# Patient Record
Sex: Female | Born: 1950 | Race: Black or African American | Hispanic: No | Smoking: Never smoker
Health system: Southern US, Community
[De-identification: ages and names within clinical notes are randomized; demographics above are authoritative.]

## PROBLEM LIST (undated history)

## (undated) DIAGNOSIS — Z87442 Personal history of urinary calculi: Secondary | ICD-10-CM

## (undated) DIAGNOSIS — D649 Anemia, unspecified: Secondary | ICD-10-CM

## (undated) DIAGNOSIS — I10 Essential (primary) hypertension: Secondary | ICD-10-CM

## (undated) DIAGNOSIS — R0600 Dyspnea, unspecified: Secondary | ICD-10-CM

## (undated) DIAGNOSIS — J45909 Unspecified asthma, uncomplicated: Secondary | ICD-10-CM

## (undated) DIAGNOSIS — E119 Type 2 diabetes mellitus without complications: Secondary | ICD-10-CM

## (undated) DIAGNOSIS — M1990 Unspecified osteoarthritis, unspecified site: Secondary | ICD-10-CM

## (undated) HISTORY — PX: MULTIPLE TOOTH EXTRACTIONS: SHX2053

---

## 1978-09-07 HISTORY — PX: GASTRIC BYPASS: SHX52

## 2004-01-05 ENCOUNTER — Ambulatory Visit: Payer: Self-pay | Admitting: Family Medicine

## 2005-01-16 ENCOUNTER — Ambulatory Visit: Payer: Self-pay | Admitting: Family Medicine

## 2005-01-20 ENCOUNTER — Ambulatory Visit: Payer: Self-pay | Admitting: Family Medicine

## 2006-09-14 ENCOUNTER — Ambulatory Visit (HOSPITAL_COMMUNITY): Admission: RE | Admit: 2006-09-14 | Discharge: 2006-09-14 | Payer: Self-pay | Admitting: Orthopedic Surgery

## 2006-09-16 ENCOUNTER — Ambulatory Visit: Payer: Self-pay | Admitting: Cardiology

## 2006-09-22 ENCOUNTER — Ambulatory Visit: Payer: Self-pay

## 2006-09-24 ENCOUNTER — Ambulatory Visit: Payer: Self-pay

## 2006-10-21 ENCOUNTER — Inpatient Hospital Stay (HOSPITAL_COMMUNITY): Admission: AD | Admit: 2006-10-21 | Discharge: 2006-10-23 | Payer: Self-pay | Admitting: Orthopedic Surgery

## 2008-09-27 ENCOUNTER — Ambulatory Visit (HOSPITAL_COMMUNITY): Admission: RE | Admit: 2008-09-27 | Discharge: 2008-09-27 | Payer: Self-pay | Admitting: Family Medicine

## 2008-11-21 ENCOUNTER — Other Ambulatory Visit: Admission: RE | Admit: 2008-11-21 | Discharge: 2008-11-21 | Payer: Self-pay | Admitting: Unknown Physician Specialty

## 2010-01-06 HISTORY — PX: ARTHOSCOPIC ROTAOR CUFF REPAIR: SHX5002

## 2010-05-21 NOTE — Op Note (Signed)
[name], Calderon            ACCOUNT NO.:  [id number]   MEDICAL RECORD NO.:  [id number]          PATIENT TYPE:  OIB   LOCATION:  5037                         FACILITY:  [facility]   PHYSICIAN:  Burnard Bunting, M.D.    DATE OF BIRTH:  Apr 27, 1950   DATE OF PROCEDURE:  10/20/2006  DATE OF DISCHARGE:                               OPERATIVE REPORT   PREOPERATIVE DIAGNOSIS:  Right shoulder rotator cuff tear and bursitis.   POSTOPERATIVE DIAGNOSIS:  Right shoulder rotator cuff tear and bursitis.   PROCEDURE:  Right shoulder diagnostic arthroscopy with subacromial  decompression and mini open rotator cuff repair.   SURGEON:  Burnard Bunting, M.D.   ASSISTANT:  Jerolyn Shin. Tresa Res, M.D.   ANESTHESIA:  General endotracheal.   ESTIMATED BLOOD LOSS:  Minimal.   INDICATION:  Cynthia Calderon is a 60 year old patient who injured her  right shoulder at work.  MRI scan consistent with rotator cuff tear.  She presents now for operative management.   OPERATIVE FINDINGS:  1. Examination under anesthesia range of motion external rotation 50      degrees, abduction 80, forward flexion 180, external rotation 90      degrees, abduction is about 95.  2. Diagnostic arthroscopy.  3. Intact glenohumeral articular surface.  4. Stable biceps anchor.  5. Rotator cuff tear of the supraspinatus measuring about 2 x 2 cm.  6. Significant bursitis.   PROCEDURE IN DETAIL:  The patient brought to operating room where  general endotracheal anesthesia was induced.  Preoperative antibiotics  were administered.  The patient was morbidly obese which made the case  exceedingly difficult in terms of retraction and visualization.  The  patient was placed with the head in neutral position in the Schlein type  positioner and then sat upright.  The right arm, hand, elbow and  shoulder was prepped with DuraPrep solution and draped in sterile  manner.  Topographic anatomy of the shoulder was identified between the  posterior lateral and anterior margin of acromion as well as the  coracoid process.  Solution of saline with epinephrine injected  subacromial space. Saline was injected into the joint, posterior portal  was created 2 cm medial inferior to posterior lateral margin of  acromion.  Diagnostic arthroscopy performed.  A spinal needle was placed  through the anticipated location of the anterior portal.  The biceps  tendon was stable and intact.  Rotator cuff tear of supraspinatus was  visualized measuring 2 x 2 cm.  Subscap and infraspinatus were intact.  Glenohumeral articular surfaces were intact.  There was no SLAP lesion  present.  At this time the scope was placed in the subacromial space.  Lateral portals created in anticipation of the site of the mini open  repair.  Subacromial decompression was performed along with bursectomy.  Instruments were removed.  Posterior portals closed using 3-0 nylon.  Reprepping with a DuraPrep was then performed on the shoulder region  which was then covered with Ioban.  The lateral portal incision off the  anterolateral margin of the acromion was extended.  Skin subcutaneous  tissue were sharply divided, bleeding  points encountered controlled  using electrocautery.  Deltoid split a measured distance of 4 cm from  the anterior lateral margin of the acromion.  Stay suture #1 Vicryl was  placed.  The rotator cuff tear was visualized and tear was a U-shaped  tear.  It was brought together using two interrupted inverted 2-0  FiberWire sutures.  Then tear was brought down to a bleeding footprint,  bleeding bony footprint using two 5.5 corkscrew suture anchors and two  4.5 push locks.  Watertight repair was achieved.  Arm was taken through  range of motion and found to have no grinding or crepitus.  Adequate  decompression was visualized. CA ligament was released but not resected.  At this time the shoulder was thoroughly irrigated.  Deltoid was closed  using a #1  Vicryl suture.  Skin was closed using interrupted inverted 2-  0 Vicryl suture followed by a running 3-0 pullout Prolene.  Bulky  dressing was applied along with a sling.  The patient tolerated  procedure well without immediate complications.  Should be noted that  Dr. Lenny Pastel assistance was required at all times during the case for  retraction of important neurovascular structures and positioning of the  arm, especially in this morbidly obese patient.  With him positioning of  the arm was difficult due to the sheer size.  Her body mass index  exceeds 35.      Burnard Bunting, M.D.  Electronically Signed     GSD/MEDQ  D:  10/20/2006  T:  10/21/2006  Job:  [id number]

## 2010-05-21 NOTE — Assessment & Plan Note (Signed)
[facility]                            CARDIOLOGY OFFICE NOTE   Cynthia, ARMENDAREZ                     MRN:          [id number]  DATE:09/16/2006                            DOB:          [birth year]    Cynthia Calderon is a very pleasant 60 year old female who I am asked to  evaluate for chest pain and preoperatively prior to repair of rotator  cuff.  Note, she has no prior cardiac history.  She typically does not  have dyspnea on exertion, orthopnea, PND, pedal edema, palpitations,  presyncope, syncope, or exertional chest pain.  She recently hurt her  right shoulder while working.  She was scheduled for rotator cuff  surgery.  However, this past Saturday she had chest pain.  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  It was in the substernal area and was  described as a sharp sensation radiating to the back.  It lasted for  approximately 10-15 minutes and resolved spontaneously. The pain was not  pleuritic or positional nor was it related to food.  There was no  associated nausea and vomiting, shortness of breath, or diaphoresis.  She drank a Coke and belched and eventually the pain resolved.  She has  had no symptoms since then.  Because of the above, we were asked to  further evaluated.   MEDICATIONS:  Relafen 750 mg p.o. b.i.d.   ALLERGIES:  No known drug allergies.   SOCIAL HISTORY:  She does not smoke nor does she consume alcohol.   FAMILY HISTORY:  Positive for congestive heart failure.  She also states  her father had coronary artery disease.   PAST MEDICAL HISTORY:  There is no diabetes mellitus or hyperlipidemia.  She does have a history of hypertension when she was on birth control  pills.  She has had previous gastric bypass surgery and lost  approximately 300 pounds at that time in the 1980's.  She otherwise has  no significant past medical history.  She has had two children.   REVIEW OF SYSTEMS:  She denies any headaches,  fevers, or chills.  There  is no productive cough or hemoptysis.  There is no dysphagia,  odynophagia, melena, or hematochezia.  There is no dysuria or hematuria.  There is no rash or seizure activity.  There is no orthopnea or PND, but  she can occasionally have pedal edema in the left lower extremity which  has been a longterm issue.  She does occasionally have pain in the right  shoulder where she had her injury.  The remaining systems are negative.   PHYSICAL EXAMINATION:  VITAL SIGNS:  Blood pressure 156/82, pulse 67.  She weighs 311 pounds.  GENERAL:  She is well-developed and morbidly obese.  She is in no acute  distress.  SKIN:  Warm and dry.  She does not appear depressed.  EXTREMITIES:  No peripheral clubbing.  BACK:  Normal.  HEENT:  Normal with normal eye lids.  NECK:  Supple with a normal upstroke bilaterally and I cannot appreciate  bruits.  No jugular venous distention and no thyromegaly is noted.  CHEST:  Clear to auscultation with normal expansion.  HEART:  Regular rate and rhythm with normal S1 and S2.  There is a soft  1/6 systolic ejection murmur at the left sternal border.  There is no S3  or S4.  ABDOMEN:  Nontender and nondistended.  Positive bowel sounds.  No  hepatosplenomegaly.  No masses appreciated.  No abdominal bruit.  She is  status post gastric bypass surgery and the previous incision site is  evident.  Her femoral pulses are difficult to palpate due to her  obesity.  I cannot appreciate bruits.  EXTREMITIES:  Trace edema bilaterally.  There are no cords palpated.  She has 2+ dorsalis pedis pulses bilaterally.  NEUROLOGY:  Grossly intact.   EKG shows a sinus rhythm at a rate of 57.  The axis is normal.  No ST  changes noted.   DIAGNOSIS:  1. Atypical chest pain - the patient's symptoms are atypical and her      electrocardiogram is normal.  However, given her upcoming surgery      we will plan to risk stratify with an Adenosine Myoview.  If it       shows no ischemia then I do not think we need to pursue further      cardiac workup.  2. Preoperative evaluation - aspirin.  3. Elevated blood pressure - her blood pressure is mildly elevated      today and this will need to be tracked and an antihypertensive      initiated as indicated.  I will leave this to her primary care      physician.  4. History of osteoarthritis.  5. Obesity.   We will see her back on a p.r.n. basis.     Madolyn Frieze Jens Som, MD, [facility]  Electronically Signed    BSC/MedQ  DD: 09/16/2006  DT: 09/17/2006  Job #: [id number]   cc:   G. Dorene Grebe, M.D.

## 2010-05-24 NOTE — Discharge Summary (Signed)
[name], [name]            ACCOUNT NO.:  [id number]   MEDICAL RECORD NO.:  [id number]          PATIENT TYPE:  INP   LOCATION:  5037                         FACILITY:  [facility]   PHYSICIAN:  Burnard Bunting, M.D.    DATE OF BIRTH:  [birth year]   DATE OF ADMISSION:  10/20/2006  DATE OF DISCHARGE:  10/23/2006                               DISCHARGE SUMMARY   DISCHARGE DIAGNOSIS:  Right shoulder rotator cuff tear.   SECONDARY DIAGNOSES:  1. Hypertension.  2. Obesity.   OPERATIONS AND PROCEDURES:  Right shoulder rotator cuff repair performed  October 20, 2006.   HOSPITAL COURSE:  Cynthia Calderon is a 60 year old patient with  shoulder rotator cuff tear.  She presents for operative management.  She  underwent rotator cuff tear repair and arthroscopy October 20, 2006.  She tolerated the procedure well without immediate complications.  She  required IV pain medicines for the following 2 days.  Her fingers were  mobile.  Therapy was started for mobilization of her shoulder in a  passive manner with pendulum exercises.  She had an otherwise  unremarkable recovery.  Finally was to be seen on postop day #2 because  of inability to void.  Pain was controlled on oral pain medications.  She will continue her sling and will follow up with me in 7 days for  suture removal.   DISCHARGE MEDICATIONS:  1. Percocet 1-2 p.o. q.3-4 hours p.r.n. pain.  2. Robaxin 500 mg IV q.6 hours p.r.n. spasm.   DISPOSITION:  She was discharged in good condition.  She will also have  a TPN machine at home.      Burnard Bunting, M.D.  Electronically Signed     GSD/MEDQ  D:  12/06/2006  T:  12/07/2006  Job:  [id number]

## 2010-10-17 LAB — BASIC METABOLIC PANEL
BUN: 16
BUN: 17
CO2: 22
Calcium: 9.2
Calcium: 9.6
Creatinine, Ser: 1.33 — ABNORMAL HIGH
GFR calc Af Amer: 50 — ABNORMAL LOW
GFR calc non Af Amer: 41 — ABNORMAL LOW
GFR calc non Af Amer: >60
Glucose, Bld: 80
Glucose, Bld: 97
Potassium: 4.6
Sodium: 137

## 2010-10-17 LAB — CBC
MCHC: 31.9
Platelets: 300

## 2010-10-18 LAB — BASIC METABOLIC PANEL
BUN: 14
Calcium: 8.8
Chloride: 110
Creatinine, Ser: 0.76
GFR calc Af Amer: >60
Glucose, Bld: 84
Potassium: 5.5 — ABNORMAL HIGH

## 2010-10-18 LAB — CBC
HCT: 39.6
MCHC: 32.1
MCV: 83.5
Platelets: 178
WBC: 7.8

## 2011-05-29 ENCOUNTER — Encounter (INDEPENDENT_AMBULATORY_CARE_PROVIDER_SITE_OTHER): Payer: Self-pay | Admitting: *Deleted

## 2011-06-03 ENCOUNTER — Other Ambulatory Visit (HOSPITAL_COMMUNITY): Payer: Self-pay | Admitting: Family Medicine

## 2011-06-03 DIAGNOSIS — Z139 Encounter for screening, unspecified: Secondary | ICD-10-CM

## 2011-06-03 DIAGNOSIS — M8580 Other specified disorders of bone density and structure, unspecified site: Secondary | ICD-10-CM

## 2011-06-05 ENCOUNTER — Encounter (INDEPENDENT_AMBULATORY_CARE_PROVIDER_SITE_OTHER): Payer: Self-pay | Admitting: *Deleted

## 2011-06-05 ENCOUNTER — Other Ambulatory Visit (INDEPENDENT_AMBULATORY_CARE_PROVIDER_SITE_OTHER): Payer: Self-pay | Admitting: *Deleted

## 2011-06-05 ENCOUNTER — Telehealth (INDEPENDENT_AMBULATORY_CARE_PROVIDER_SITE_OTHER): Payer: Self-pay | Admitting: *Deleted

## 2011-06-05 DIAGNOSIS — Z1211 Encounter for screening for malignant neoplasm of colon: Secondary | ICD-10-CM

## 2011-06-05 MED ORDER — PEG-KCL-NACL-NASULF-NA ASC-C 100 G PO SOLR: 1.0000 | Freq: Once | ORAL | Status: DC

## 2011-06-05 NOTE — Telephone Encounter (Signed)
Patient needs movi prep 

## 2011-06-06 ENCOUNTER — Ambulatory Visit (HOSPITAL_COMMUNITY)
Admission: RE | Admit: 2011-06-06 | Discharge: 2011-06-06 | Disposition: A | Discharge status: Home / Self Care | Payer: Medicare Other | Source: Ambulatory Visit | Attending: Family Medicine | Admitting: Family Medicine

## 2011-06-06 DIAGNOSIS — Z139 Encounter for screening, unspecified: Secondary | ICD-10-CM

## 2011-06-06 DIAGNOSIS — M899 Disorder of bone, unspecified: Secondary | ICD-10-CM | POA: Insufficient documentation

## 2011-06-06 DIAGNOSIS — M8580 Other specified disorders of bone density and structure, unspecified site: Secondary | ICD-10-CM

## 2011-06-06 DIAGNOSIS — Z1231 Encounter for screening mammogram for malignant neoplasm of breast: Secondary | ICD-10-CM | POA: Insufficient documentation

## 2011-07-24 ENCOUNTER — Encounter (HOSPITAL_COMMUNITY): Payer: Self-pay | Admitting: Pharmacy Technician

## 2011-07-28 ENCOUNTER — Telehealth (INDEPENDENT_AMBULATORY_CARE_PROVIDER_SITE_OTHER): Payer: Self-pay | Admitting: *Deleted

## 2011-07-28 NOTE — Telephone Encounter (Signed)
PCP/Requesting MD: nyland  Name & DOB: Cynthia Calderon 01/28/1950     Procedure: tcs  Reason/Indication:  screening  Has patient had this procedure before?  no  If so, when, by whom and where?    Is there a family history of colon cancer?  no  Who?  What age when diagnosed?    Is patient diabetic?   no      Does patient have prosthetic heart valve?  no  Do you have a pacemaker?  no  Has patient had joint replacement within last 12 months?  no  Is patient on Coumadin, Plavix and/or Aspirin? no  Medications: tramadol 50 mg 1 tab bid, celebrex 200 mg bid, vitamins, tylenol prn  Allergies: nkda  Medication Adjustment:   Procedure date & time: 08/07/11 at 930

## 2011-07-28 NOTE — Telephone Encounter (Signed)
agree

## 2011-08-07 ENCOUNTER — Ambulatory Visit (HOSPITAL_COMMUNITY)
Admission: RE | Admit: 2011-08-07 | Discharge: 2011-08-07 | Disposition: A | Discharge status: Home / Self Care | Payer: Medicare Other | Source: Ambulatory Visit | Attending: Internal Medicine | Admitting: Internal Medicine

## 2011-08-07 ENCOUNTER — Encounter (HOSPITAL_COMMUNITY): Payer: Self-pay | Admitting: *Deleted

## 2011-08-07 ENCOUNTER — Encounter (HOSPITAL_COMMUNITY)
Admission: RE | Disposition: A | Discharge status: Home / Self Care | Payer: Self-pay | Source: Ambulatory Visit | Attending: Internal Medicine

## 2011-08-07 DIAGNOSIS — K5730 Diverticulosis of large intestine without perforation or abscess without bleeding: Secondary | ICD-10-CM | POA: Insufficient documentation

## 2011-08-07 DIAGNOSIS — Z1211 Encounter for screening for malignant neoplasm of colon: Secondary | ICD-10-CM

## 2011-08-07 HISTORY — PX: COLONOSCOPY: SHX5424

## 2011-08-07 HISTORY — DX: Unspecified asthma, uncomplicated: J45.909

## 2011-08-07 SURGERY — COLONOSCOPY
Anesthesia: Moderate Sedation

## 2011-08-07 MED ORDER — MEPERIDINE HCL 50 MG/ML IJ SOLN
INTRAMUSCULAR | Status: DC | PRN
  Administered 2011-08-07 (×2): 25 mg via INTRAVENOUS

## 2011-08-07 MED ORDER — MIDAZOLAM HCL 5 MG/5ML IJ SOLN
INTRAMUSCULAR | Status: AC
  Filled 2011-08-07: qty 10

## 2011-08-07 MED ORDER — STERILE WATER FOR IRRIGATION IR SOLN
Status: DC | PRN
  Administered 2011-08-07: 09:00:00

## 2011-08-07 MED ORDER — MEPERIDINE HCL 50 MG/ML IJ SOLN
INTRAMUSCULAR | Status: AC
  Filled 2011-08-07: qty 1

## 2011-08-07 MED ORDER — PROMETHAZINE HCL 25 MG/ML IJ SOLN
INTRAMUSCULAR | Status: AC
  Filled 2011-08-07: qty 1

## 2011-08-07 MED ORDER — MEPERIDINE HCL 100 MG/ML IJ SOLN
INTRAMUSCULAR | Status: AC
  Filled 2011-08-07: qty 2

## 2011-08-07 MED ORDER — MIDAZOLAM HCL 5 MG/5ML IJ SOLN
INTRAMUSCULAR | Status: DC | PRN
  Administered 2011-08-07 (×3): 2 mg via INTRAVENOUS

## 2011-08-07 MED ORDER — SODIUM CHLORIDE 0.9 % IJ SOLN
INTRAMUSCULAR | Status: AC
  Filled 2011-08-07: qty 10

## 2011-08-07 MED ORDER — SODIUM CHLORIDE 0.45 % IV SOLN
Freq: Once | INTRAVENOUS | Status: AC
  Administered 2011-08-07: 09:00:00 via INTRAVENOUS

## 2011-08-07 NOTE — H&P (Signed)
Cynthia Calderon is an 61 y.o. female.   Chief Complaint: Patient is here for colonoscopy. HPI: Patient is 61 year old African female who is in for screening colonoscopy. This is patient's first exam. She denies abdominal pain melena or rectal bleeding. She takes Celebrex for chronic back pain and having no side effects. Family history is negative for colorectal carcinoma.  Past Medical History  Diagnosis Date  . Bronchial asthma     Last Spring    Past Surgical History  Procedure Date  . Gastric bypass 1980's  . Arthoscopic rotaor cuff repair 2012    History reviewed. No pertinent family history. Social History:  reports that she has never smoked. She does not have any smokeless tobacco history on file. She reports that she does not drink alcohol or use illicit drugs.  Allergies: No Known Allergies  Medications Prior to Admission  Medication Sig Dispense Refill  . acetaminophen (TYLENOL) 500 MG tablet Take 500-1,000 mg by mouth every 6 (six) hours as needed. For pain      . Calcium Carbonate-Vitamin D (CALCIUM + D PO) Take 1 tablet by mouth daily.      . celecoxib (CELEBREX) 200 MG capsule Take 200 mg by mouth 2 (two) times daily.      . cyanocobalamin (,VITAMIN B-12,) 1000 MCG/ML injection Inject 1,000 mcg into the muscle once a week.      . Multiple Vitamin (MULTIVITAMIN WITH MINERALS) TABS Take 1 tablet by mouth daily.      Marland Kitchen omeprazole (PRILOSEC) 20 MG capsule Take 20 mg by mouth daily.      . peg 3350 powder (MOVIPREP) 100 G SOLR Take 1 kit (100 g total) by mouth once.  1 kit  0    No results found for this or any previous visit (from the past 48 hour(s)). No results found.  ROS  Blood pressure 154/78, pulse 56, temperature 97.9 F (36.6 C), temperature source Oral, resp. rate 18, SpO2 96.00%. Physical Exam  Constitutional: She appears well-developed and well-nourished.  HENT:  Mouth/Throat: Oropharynx is clear and moist.  Eyes: Conjunctivae are normal. No scleral  icterus.  Neck: No thyromegaly present.  Cardiovascular: Normal rate, regular rhythm and normal heart sounds.   No murmur heard. Respiratory: Effort normal and breath sounds normal.  GI: Soft. She exhibits no distension and no mass. There is no tenderness.  Musculoskeletal: She exhibits no edema.  Lymphadenopathy:    She has no cervical adenopathy.  Neurological: She is alert.  Skin: Skin is warm and dry.     Assessment/Plan Average risk screening colonoscopy.  REHMAN,NAJEEB U 08/07/2011, 9:18 AM

## 2011-08-07 NOTE — Op Note (Signed)
COLONOSCOPY PROCEDURE REPORT  PATIENT:  Cynthia Calderon  MR#:  [id number] Birthdate:  1951-01-06, 61 y.o., female Endoscopist:  Dr. Malissa Hippo, MD Referred By:  Dr. Josue Hector, MD Procedure Date: 08/07/2011  Procedure:   Colonoscopy  Indications:  Patient is 61 year old African female was undergoing average risk screening colonoscopy.  Informed Consent:  The procedure and risks were reviewed with the patient and informed consent was obtained.  Medications:  Demerol 50 mg IV Versed 6 mg IV  Description of procedure:  After a digital rectal exam was performed, that colonoscope was advanced from the anus through the rectum and colon to the area of the cecum, ileocecal valve and appendiceal orifice. The cecum was deeply intubated. These structures were well-seen and photographed for the record. From the level of the cecum and ileocecal valve, the scope was slowly and cautiously withdrawn. The mucosal surfaces were carefully surveyed utilizing scope tip to flexion to facilitate fold flattening as needed. The scope was pulled down into the rectum where a thorough exam including retroflexion was performed.  Findings:   Prep satisfactory. 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 No evidence of colonic polyps or other mucosal abnormalities. Normal rectal mucosa and anorectal junction.  Therapeutic/Diagnostic Maneuvers Performed:  None  Complications:  None  Cecal Withdrawal Time:  10 minutes  Impression:  Examination performed to cecum. Normal colonoscopy except single small diverticulum at hepatic flexure.  Recommendations:  Standard instructions given. Next screening exam in 10 years.  [name]  08/07/2011 9:44 AM  CC: Dr. Josue Hector, MD & Dr. Bonnetta Barry ref. provider found

## 2011-08-12 ENCOUNTER — Encounter (HOSPITAL_COMMUNITY): Payer: Self-pay | Admitting: Internal Medicine

## 2012-06-01 ENCOUNTER — Other Ambulatory Visit (HOSPITAL_COMMUNITY): Payer: Self-pay | Admitting: Family Medicine

## 2012-06-01 DIAGNOSIS — Z139 Encounter for screening, unspecified: Secondary | ICD-10-CM

## 2012-06-07 ENCOUNTER — Ambulatory Visit (HOSPITAL_COMMUNITY): Payer: Medicare Other

## 2012-06-08 ENCOUNTER — Ambulatory Visit (HOSPITAL_COMMUNITY)
Admission: RE | Admit: 2012-06-08 | Discharge: 2012-06-08 | Disposition: A | Discharge status: Home / Self Care | Payer: Medicare Other | Source: Ambulatory Visit | Attending: Family Medicine | Admitting: Family Medicine

## 2012-06-08 DIAGNOSIS — Z1231 Encounter for screening mammogram for malignant neoplasm of breast: Secondary | ICD-10-CM | POA: Insufficient documentation

## 2012-06-08 DIAGNOSIS — Z139 Encounter for screening, unspecified: Secondary | ICD-10-CM

## 2013-05-10 ENCOUNTER — Other Ambulatory Visit (HOSPITAL_COMMUNITY): Payer: Self-pay | Admitting: Adult Health Nurse Practitioner

## 2013-05-10 DIAGNOSIS — Z1231 Encounter for screening mammogram for malignant neoplasm of breast: Secondary | ICD-10-CM

## 2013-06-09 ENCOUNTER — Ambulatory Visit (HOSPITAL_COMMUNITY)
Admission: RE | Admit: 2013-06-09 | Discharge: 2013-06-09 | Disposition: A | Discharge status: Home / Self Care | Payer: Medicare Other | Source: Ambulatory Visit | Attending: Adult Health Nurse Practitioner | Admitting: Adult Health Nurse Practitioner

## 2013-06-09 DIAGNOSIS — Z1231 Encounter for screening mammogram for malignant neoplasm of breast: Secondary | ICD-10-CM

## 2014-05-05 ENCOUNTER — Other Ambulatory Visit (HOSPITAL_COMMUNITY): Payer: Self-pay | Admitting: Adult Health Nurse Practitioner

## 2014-05-05 DIAGNOSIS — Z1231 Encounter for screening mammogram for malignant neoplasm of breast: Secondary | ICD-10-CM

## 2014-06-12 ENCOUNTER — Ambulatory Visit (HOSPITAL_COMMUNITY)
Admission: RE | Admit: 2014-06-12 | Discharge: 2014-06-12 | Disposition: A | Discharge status: Home / Self Care | Payer: Medicare HMO | Source: Ambulatory Visit | Attending: Adult Health Nurse Practitioner | Admitting: Adult Health Nurse Practitioner

## 2014-06-12 DIAGNOSIS — Z1231 Encounter for screening mammogram for malignant neoplasm of breast: Secondary | ICD-10-CM | POA: Diagnosis present

## 2015-05-29 ENCOUNTER — Other Ambulatory Visit (HOSPITAL_COMMUNITY): Payer: Self-pay | Admitting: Family Medicine

## 2015-05-29 DIAGNOSIS — Z1231 Encounter for screening mammogram for malignant neoplasm of breast: Secondary | ICD-10-CM

## 2015-06-13 ENCOUNTER — Ambulatory Visit (HOSPITAL_COMMUNITY)
Admission: RE | Admit: 2015-06-13 | Discharge: 2015-06-13 | Disposition: A | Discharge status: Home / Self Care | Payer: Medicare HMO | Source: Ambulatory Visit | Attending: Family Medicine | Admitting: Family Medicine

## 2015-06-13 DIAGNOSIS — Z1231 Encounter for screening mammogram for malignant neoplasm of breast: Secondary | ICD-10-CM | POA: Insufficient documentation

## 2015-09-27 ENCOUNTER — Other Ambulatory Visit (HOSPITAL_COMMUNITY): Payer: Self-pay | Admitting: Adult Health Nurse Practitioner

## 2015-09-27 DIAGNOSIS — Z780 Asymptomatic menopausal state: Secondary | ICD-10-CM

## 2015-10-03 ENCOUNTER — Other Ambulatory Visit (HOSPITAL_COMMUNITY): Payer: Self-pay | Admitting: Adult Health Nurse Practitioner

## 2015-10-03 ENCOUNTER — Ambulatory Visit (HOSPITAL_COMMUNITY)
Admission: RE | Admit: 2015-10-03 | Discharge: 2015-10-03 | Disposition: A | Discharge status: Home / Self Care | Payer: Medicare HMO | Source: Ambulatory Visit | Attending: Adult Health Nurse Practitioner | Admitting: Adult Health Nurse Practitioner

## 2015-10-03 DIAGNOSIS — Z780 Asymptomatic menopausal state: Secondary | ICD-10-CM | POA: Diagnosis present

## 2015-10-03 DIAGNOSIS — M85832 Other specified disorders of bone density and structure, left forearm: Secondary | ICD-10-CM | POA: Diagnosis not present

## 2015-10-03 DIAGNOSIS — Z13820 Encounter for screening for osteoporosis: Secondary | ICD-10-CM | POA: Diagnosis present

## 2015-10-03 DIAGNOSIS — E559 Vitamin D deficiency, unspecified: Secondary | ICD-10-CM | POA: Insufficient documentation

## 2016-05-19 ENCOUNTER — Other Ambulatory Visit (HOSPITAL_COMMUNITY): Payer: Self-pay | Admitting: Family Medicine

## 2016-05-19 DIAGNOSIS — Z1231 Encounter for screening mammogram for malignant neoplasm of breast: Secondary | ICD-10-CM

## 2016-06-18 ENCOUNTER — Ambulatory Visit (HOSPITAL_COMMUNITY)
Admission: RE | Admit: 2016-06-18 | Discharge: 2016-06-18 | Disposition: A | Discharge status: Home / Self Care | Payer: Medicare HMO | Source: Ambulatory Visit | Attending: Family Medicine | Admitting: Family Medicine

## 2016-06-18 DIAGNOSIS — Z1231 Encounter for screening mammogram for malignant neoplasm of breast: Secondary | ICD-10-CM | POA: Insufficient documentation

## 2017-03-24 NOTE — Congregational Nurse Program (Signed)
Congregational Nurse Program Note  Date of Encounter: 03/24/2017  Past Medical History: Past Medical History:  Diagnosis Date  . Bronchial asthma    Last Spring    Encounter Details: CNP Questionnaire - 03/17/17 1800      Questionnaire   Patient Status  Not Applicable    Race  Black or African American    Location Patient [place], [place]  No food insecurities    Housing/Utilities  Yes, have permanent housing    Transportation  No transportation needs    Interpersonal Safety  Yes, feel physically and emotionally safe where you currently live    Medication  No medication insecurities    Medical Provider  Yes    Referrals  Primary Care Provider/Clinic    ED Visit Averted  Not Applicable    Life-Saving Intervention Made  Not Applicable      Blood Pressure 168/84 P 70 To see MD on Thursday.Stated she has been taking a lot of allergy medications Erma Heritage RN, [place] [facility] [phone]

## 2017-03-24 NOTE — Congregational Nurse Program (Signed)
Congregational Nurse Program Note  Date of Encounter: 03/24/2017  Past Medical History: Past Medical History:  Diagnosis Date  . Bronchial asthma    Last Spring    Encounter Details: CNP Questionnaire - 03/24/17 2252      Questionnaire   Patient Status  Not Applicable    Race  Black or African American    Location Patient [place], [place]  No food insecurities    Housing/Utilities  Yes, have permanent housing    Transportation  No transportation needs    Interpersonal Safety  Yes, feel physically and emotionally safe where you currently live    Medication  No medication insecurities    Medical Provider  Yes    Referrals  Not Applicable    ED Visit Averted  Not Applicable    Life-Saving Intervention Made  Not Applicable     B/P 719/59  P 73 Seen at MD last  Week; no changes in meds Erma Heritage RN, [place] [facility] [phone]

## 2017-07-29 ENCOUNTER — Other Ambulatory Visit (HOSPITAL_COMMUNITY): Payer: Self-pay | Admitting: Family Medicine

## 2017-07-29 DIAGNOSIS — Z1231 Encounter for screening mammogram for malignant neoplasm of breast: Secondary | ICD-10-CM

## 2017-08-06 ENCOUNTER — Ambulatory Visit (HOSPITAL_COMMUNITY)
Admission: RE | Admit: 2017-08-06 | Discharge: 2017-08-06 | Disposition: A | Discharge status: Home / Self Care | Payer: Medicare HMO | Source: Ambulatory Visit | Attending: Family Medicine | Admitting: Family Medicine

## 2017-08-06 DIAGNOSIS — Z1231 Encounter for screening mammogram for malignant neoplasm of breast: Secondary | ICD-10-CM | POA: Insufficient documentation

## 2018-02-11 NOTE — Congregational Nurse Program (Signed)
Seen for B P check. . Discussed problems caused by HTN,discussed importance of eating a heart healthy diet, getting at least 30 minutes of exercise Up to five days per week, getting the proper exercise, and drinking more water and less sodas.Voiced understanding.Stated she has a an appointment wit her PCP next week. Also stated that she takes her medications as prescribed. Erma Heritage RN, [facility], [phone]

## 2018-02-23 NOTE — Congregational Nurse Program (Signed)
No complaints or concerns B P 141/84 P [address] RN, [place] [facility], [phone]

## 2018-03-14 NOTE — Congregational Nurse Program (Signed)
No complaints or concerns. B P 127/70 P [address] RN, [place] [facility], [phone]

## 2018-03-30 NOTE — Congregational Nurse Program (Signed)
No medical concerns or complaints B P 138/82 , P [address], [place] PNSQZYT,[phone]

## 2018-04-01 NOTE — Congregational Nurse Program (Signed)
No complaints or concerns. Taking medications as prescribed for hypertension.reviewed low sodium heart healthy diet, and the importance of exercise. B P 157/76 P 72 Voiced understanding. Erma Heritage RN, [facility], [phone]

## 2018-04-20 NOTE — Congregational Nurse Program (Signed)
No complaints or concerns BP 156/74  P 61. Stated  She has an appointment next week with PA Erma Heritage RN, [facility], [phone]

## 2018-05-15 NOTE — Congregational Nurse Program (Signed)
No complaints . Reviewed COVID 19 precautions. BP125/80 P [address] RN, [place], [phone]

## 2018-05-15 NOTE — Congregational Nurse Program (Signed)
No complaints. Discussed the CDC guidelines for COVID-19. BP 143/83 P [address] RN, [place] [facility], [phone]

## 2018-05-25 NOTE — Congregational Nurse Program (Signed)
Stated she is doing fine and   Discussed signs and symptoms of CO_VID 19 and importance of following the CDC's recommendations for social distancing,hand washing,  and wearing masks. Voiced understanding BP 142/80 P [address] RN, [place] [facility], [phone]

## 2018-05-25 NOTE — Congregational Nurse Program (Signed)
.  No complaints or concerns. Reminded to continue to follow safety guidelines for CO_VID 19. BP 127/81 P [address] RN, [place] [facility], [phone]

## 2018-06-03 NOTE — Congregational Nurse Program (Signed)
No complaints or concerns. BP 138/86 P [address] [employer], [phone]

## 2018-06-08 NOTE — Congregational Nurse Program (Signed)
No complaints orrconcerns. Reviewed CO_VID safety and importance of social distancing, handwashing and wearing masks Blood sugar  Before dinner [place],  [place], [phone]

## 2018-06-15 NOTE — Congregational Nurse Program (Signed)
No complaints or concerns.Reviewed s/s of CO-VID 19. Temp 97.5 B P 150/84 P-  [place], [place], [phone]

## 2018-06-22 NOTE — Congregational Nurse Program (Signed)
No complaints or concerns. Discussed signs and symptoms of COVID-19 BP 147/73 P [address] RN,[place] [facility], [phone]

## 2018-06-24 ENCOUNTER — Other Ambulatory Visit: Payer: Self-pay | Admitting: *Deleted

## 2018-06-24 DIAGNOSIS — Z20822 Contact with and (suspected) exposure to covid-19: Secondary | ICD-10-CM

## 2018-06-24 NOTE — Progress Notes (Signed)
la 

## 2018-06-28 LAB — SPECIMEN STATUS REPORT

## 2018-06-28 LAB — NOVEL CORONAVIRUS, NAA: SARS-CoV-2, NAA: NOT DETECTED

## 2018-06-29 ENCOUNTER — Other Ambulatory Visit (HOSPITAL_COMMUNITY): Payer: Self-pay | Admitting: Family Medicine

## 2018-06-29 DIAGNOSIS — Z1231 Encounter for screening mammogram for malignant neoplasm of breast: Secondary | ICD-10-CM

## 2018-06-30 ENCOUNTER — Telehealth: Payer: Self-pay | Admitting: *Deleted

## 2018-06-30 NOTE — Telephone Encounter (Addendum)
Pt called and given test results of negative COVID; she verbalized understanding; not able to chart in result note.

## 2018-08-09 ENCOUNTER — Ambulatory Visit (HOSPITAL_COMMUNITY)
Admission: RE | Admit: 2018-08-09 | Discharge: 2018-08-09 | Disposition: A | Discharge status: Home / Self Care | Payer: Medicare HMO | Source: Ambulatory Visit | Attending: Family Medicine | Admitting: Family Medicine

## 2018-08-09 ENCOUNTER — Other Ambulatory Visit: Payer: Self-pay

## 2018-08-09 DIAGNOSIS — Z1231 Encounter for screening mammogram for malignant neoplasm of breast: Secondary | ICD-10-CM | POA: Diagnosis present

## 2018-11-23 ENCOUNTER — Other Ambulatory Visit: Payer: Self-pay | Admitting: *Deleted

## 2018-11-23 DIAGNOSIS — Z20822 Contact with and (suspected) exposure to covid-19: Secondary | ICD-10-CM

## 2018-11-25 LAB — NOVEL CORONAVIRUS, NAA: SARS-CoV-2, NAA: NOT DETECTED

## 2019-01-14 NOTE — Congregational Nurse Program (Signed)
No concerns or complaints.BP 132/84  P61  Temp [address] RN, [place] [facility], [phone]

## 2019-01-14 NOTE — Congregational Nurse Program (Signed)
No complaints or concerns. Sees MD on regular basis. BP 132/84- P-61 Temp 97.2 Erma Heritage RN,[place] [facility] [phone]

## 2019-01-14 NOTE — Congregational Nurse Program (Signed)
No complaints Temp 97.2/ BP 134/83/ P [place], [place] [facility], [phone]

## 2019-02-03 NOTE — Congregational Nurse Program (Signed)
No voiced complaints today Temp 97.1  BP 115/76 P -62. Erma Heritage RN, [facility], [phone]

## 2019-02-23 ENCOUNTER — Ambulatory Visit: Payer: Medicare HMO | Attending: Internal Medicine

## 2019-02-23 ENCOUNTER — Other Ambulatory Visit: Payer: Self-pay

## 2019-02-23 DIAGNOSIS — Z23 Encounter for immunization: Secondary | ICD-10-CM

## 2019-02-23 NOTE — Progress Notes (Signed)
   Covid-19 Vaccination Clinic  Name:  Cynthia Calderon    MRN: [id number] DOB: August 25, 1950  02/23/2019  [name] was observed post Covid-19 immunization for 15 minutes without incidence. She was provided with Vaccine Information Sheet and instruction to access the V-Safe system.   [name] was instructed to call 911 with any severe reactions post vaccine: Marland Kitchen Difficulty breathing  . Swelling of your face and throat  . A fast heartbeat  . A bad rash all over your body  . Dizziness and weakness    Immunizations Administered    Name Date Dose VIS Date Route   Moderna COVID-19 Vaccine 02/23/2019 10:52 AM 0.5 mL 12/07/2018 Intramuscular   Manufacturer: Moderna   Lot: [id number]   PagePO:[id number]

## 2019-03-23 ENCOUNTER — Ambulatory Visit: Payer: Medicare HMO | Attending: Family

## 2019-03-23 DIAGNOSIS — Z23 Encounter for immunization: Secondary | ICD-10-CM

## 2019-03-23 NOTE — Progress Notes (Signed)
   Covid-19 Vaccination Clinic  Name:  Cynthia Calderon    MRN: [id number] DOB: [birth year]  03/23/2019  [name] was observed post Covid-19 immunization for 15 minutes without incident. She was provided with Vaccine Information Sheet and instruction to access the V-Safe system.   [name] was instructed to call 911 with any severe reactions post vaccine: Marland Kitchen Difficulty breathing  . Swelling of face and throat  . A fast heartbeat  . A bad rash all over body  . Dizziness and weakness   Immunizations Administered    Name Date Dose VIS Date Route   Moderna COVID-19 Vaccine 03/23/2019  9:35 AM 0.5 mL 12/07/2018 Intramuscular   Manufacturer: Moderna   Lot: [id number]   Magnet CovePO:[id number]

## 2019-05-13 NOTE — Congregational Nurse Program (Signed)
No complaints of pain or discomfort Sees PCP on regular basis. Temp-97, BP 151/72  P-72 [place], [place], [phone]

## 2020-02-07 NOTE — Congregational Nurse Program (Signed)
  Dept: [phone]   Congregational Nurse Program Note  Date of Encounter: 01/10/2020  Past Medical History: Past Medical History:  Diagnosis Date  . Bronchial asthma    Last Spring    Encounter Details:  CNP Questionnaire - 01/10/20 1745      Questionnaire   Do you give verbal consent to treat you today? Yes    Visit Setting Church or Counselling psychologist Patient Served At [facility] of [employer], [place]    Patient Status Not Applicable    Medical Provider Yes    Insurance Private Insurance    Intervention Assess (including screenings)          Stated she was doing nfine; no problem with asthma in several weeks. Just like to [facility] check 0n Her blood sugar and blood pressure. BP 143/84 P 53 Blood Glucose [address] RN, [place], [phone]

## 2020-02-07 NOTE — Congregational Nurse Program (Signed)
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 BP 156/80 P 55 Blood Glucose 95 Told her about mindfullness.  And we did a few deep breathing exercises. Also discussed diet and importance of exercise Thora Lance RN, [place], [phone]

## 2020-02-07 NOTE — Congregational Nurse Program (Signed)
  Dept: [phone]   Congregational Nurse Program Note  Date of Encounter: 01/10/2020  Past Medical History: Past Medical History:  Diagnosis Date  . Bronchial asthma    Last Spring    Encounter Details:  CNP Questionnaire - 01/17/20 2245      Questionnaire   Do you give verbal consent to treat you today? Yes    Visit Setting Church or Counselling psychologist Patient Served At [facility] [employer], [place]    Patient Status Not Applicable    Medical Provider Yes    Insurance Private Insurance    Intervention Assess (including screenings)         No complaints or concerns. BP was  elevated on last week but stated she was very stressed at the time. BP 102/69  P 55  Blood Glucose [address], [place], [phone]

## 2020-02-21 NOTE — Congregational Nurse Program (Signed)
  Dept: [phone]   Congregational Nurse Program Note  Date of Encounter: 02/21/2020  Past Medical History: Past Medical History:  Diagnosis Date  . Bronchial asthma    Last Spring    Encounter Details:  CNP Questionnaire - 02/21/20 1740      Questionnaire   Do you give verbal consent to treat you today? Yes    Visit Setting Church or Counselling psychologist Patient Served At [facility] [employer], [place]    Patient Status Not Applicable    Medical Provider Yes    Insurance Private Insurance    Intervention Assess (including screenings)         No complaints or concerns. BP 128/75 P- Blood Glucose 97 randomly. Erma Heritage  RN, [place], [phone]  -5

## 2020-03-09 ENCOUNTER — Other Ambulatory Visit (HOSPITAL_COMMUNITY): Payer: Self-pay | Admitting: Family Medicine

## 2020-03-09 DIAGNOSIS — Z1231 Encounter for screening mammogram for malignant neoplasm of breast: Secondary | ICD-10-CM

## 2020-03-21 ENCOUNTER — Ambulatory Visit (HOSPITAL_COMMUNITY)
Admission: RE | Admit: 2020-03-21 | Discharge: 2020-03-21 | Disposition: A | Discharge status: Home / Self Care | Payer: Medicare HMO | Source: Ambulatory Visit | Attending: Family Medicine | Admitting: Family Medicine

## 2020-03-21 DIAGNOSIS — Z1231 Encounter for screening mammogram for malignant neoplasm of breast: Secondary | ICD-10-CM | POA: Diagnosis present

## 2020-06-26 ENCOUNTER — Other Ambulatory Visit: Payer: Self-pay | Admitting: Urology

## 2020-06-27 NOTE — Progress Notes (Addendum)
COVID Vaccine Completed: Yes x2  Date COVID Vaccine completed: 02/23/19, 03/23/19 Has received booster: No COVID vaccine manufacturer:  Moderna     Date of COVID positive in last 90 days: N/A  PCP - Catalina Antigua, MD Cardiologist - N/A  Chest x-ray - 10/17/06 Epic EKG - 07/04/20 Epic Stress Test - long time ago per pt ECHO - N/A Cardiac Cath - N/A Pacemaker/ICD device last checked: N/a Spinal Cord Stimulator: N/A  Sleep Study - N/A CPAP -   Fasting Blood Sugar - 90-100 Checks Blood Sugar _____ times a day  Blood Thinner Instructions: N/A Aspirin Instructions: Last Dose:  Activity level:  Can go up a flight of stairs and perform activities of daily living without stopping and without symptoms of chest pain. Does endorse SOB with exertion, reports due to weight.    Anesthesia review: N/A  Patient denies shortness of breath, fever, cough and chest pain at PAT appointment   Patient verbalized understanding of instructions that were given to them at the PAT appointment. Patient was also instructed that they will need to review over the PAT instructions again at home before surgery.

## 2020-06-27 NOTE — Progress Notes (Signed)
Please put in orders for PAT visit scheduled 07/04/20.

## 2020-06-27 NOTE — Patient Instructions (Addendum)
DUE TO COVID-19 ONLY ONE VISITOR IS ALLOWED TO COME WITH YOU AND STAY IN THE WAITING ROOM ONLY DURING PRE OP AND PROCEDURE.   **NO VISITORS ARE ALLOWED IN THE SHORT STAY AREA OR RECOVERY ROOM!!**        Your procedure is scheduled on: 07/05/20   Report to [facility] Main  Entrance    Report to admitting at 9:30 AM   Call this number if you have problems the morning of surgery [phone]   Do not eat food :After Midnight.   May have liquids until 8:30 AM day of surgery  CLEAR LIQUID DIET  Foods Allowed                                                                     Foods Excluded  Water, Black Coffee and tea, regular and decaf                liquids that you cannot  Plain Jell-O in any flavor  (No red)                                     see through such as: Fruit ices (not with fruit pulp)                                             milk, soups, orange juice              Iced Popsicles (No red)                                                 All solid food                                   Apple juices Sports drinks like Gatorade (No red) Lightly seasoned clear broth or consume(fat free) Sugar, honey syrup     Oral Hygiene is also important to reduce your risk of infection.                                    Remember - BRUSH YOUR TEETH THE MORNING OF SURGERY WITH YOUR REGULAR TOOTHPASTE    Take these medicines the morning of surgery with A SIP OF WATER: Acetaminophen, Benzonatate, Omeprazole, Tramadol.   How to Manage Your Diabetes Before and After Surgery  Why is it important to control my blood sugar before and after surgery? Improving blood sugar levels before and after surgery helps healing and can limit problems. A way of improving blood sugar control is eating a healthy diet by:  Eating less sugar and carbohydrates  Increasing activity/exercise  Talking with your doctor about reaching your blood sugar goals High blood sugars (greater than 180 mg/dL)  can raise your risk of infections and  slow your recovery, so you will need to focus on controlling your diabetes during the weeks before surgery. Make sure that the doctor who takes care of your diabetes knows about your planned surgery including the date and location.  How do I manage my blood sugar before surgery? Check your blood sugar at least 4 times a day, starting 2 days before surgery, to make sure that the level is not too high or low. Check your blood sugar the morning of your surgery when you wake up and every 2 hours until you get to the Short Stay unit. If your blood sugar is less than 70 mg/dL, you will need to treat for low blood sugar: Do not take insulin. Treat a low blood sugar (less than 70 mg/dL) with  cup of clear juice (cranberry or apple), 4 glucose tablets, OR glucose gel. Recheck blood sugar in 15 minutes after treatment (to make sure it is greater than 70 mg/dL). If your blood sugar is not greater than 70 mg/dL on recheck, call [phone] for further instructions. Report your blood sugar to the short stay nurse when you get to Short Stay.  If you are admitted to the hospital after surgery: Your blood sugar will be checked by the staff and you will probably be given insulin after surgery (instead of oral diabetes medicines) to make sure you have good blood sugar levels. The goal for blood sugar control after surgery is 80-180 mg/dL    Reviewed and Endorsed by [facility] Patient Education Committee, August 2015                               You may not have any metal on your body including hair pins, jewelry, and body piercing             Do not wear make-up, lotions, powders, perfumes, or deodorant  Do not wear nail polish including gel and S&S, artificial/acrylic nails, or any other type of covering on natural nails including finger and toenails. If you have artificial nails, gel coating, etc. that needs to be removed by a nail salon please have this removed prior  to surgery or surgery may need to be canceled/ delayed if the surgeon/ anesthesia feels like they are unable to be safely monitored.   Do not shave  48 hours prior to surgery.    Do not bring valuables to the hospital. [place].   Contacts, dentures or bridgework may not be worn into surgery.    Patients discharged the day of surgery will not be allowed to drive home.              Please read over the following fact sheets you were given: IF YOU HAVE QUESTIONS ABOUT YOUR PRE OP INSTRUCTIONS PLEASE CALL [phone]   [place] - Preparing for Surgery Before surgery, you can play an important role.  Because skin is not sterile, your skin needs to be as free of germs as possible.  You can reduce the number of germs on your skin by washing with CHG (chlorahexidine gluconate) soap before surgery.  CHG is an antiseptic cleaner which kills germs and bonds with the skin to continue killing germs even after washing. Please DO NOT use if you have an allergy to CHG or antibacterial soaps.  If your skin becomes reddened/irritated  stop using the CHG and inform your nurse when you arrive at Short Stay. Do not shave (including legs and underarms) for at least 48 hours prior to the first CHG shower.  You may shave your face/neck.  Please follow these instructions carefully:  1.  Shower with CHG Soap the night before surgery and the  morning of surgery.  2.  If you choose to wash your hair, wash your hair first as usual with your normal  shampoo.  3.  After you shampoo, rinse your hair and body thoroughly to remove the shampoo.                             4.  Use CHG as you would any other liquid soap.  You can apply chg directly to the skin and wash.  Gently with a scrungie or clean washcloth.  5.  Apply the CHG Soap to your body ONLY FROM THE NECK DOWN.   Do   not use on face/ open                           Wound or open sores. Avoid contact with eyes,  ears mouth and   genitals (private parts).                       Wash face,  Genitals (private parts) with your normal soap.             6.  Wash thoroughly, paying special attention to the area where your    surgery  will be performed.  7.  Thoroughly rinse your body with warm water from the neck down.  8.  DO NOT shower/wash with your normal soap after using and rinsing off the CHG Soap.                9.  Pat yourself dry with a clean towel.            10.  Wear clean pajamas.            11.  Place clean sheets on your bed the night of your first shower and do not  sleep with pets. Day of Surgery : Do not apply any lotions/deodorants the morning of surgery.  Please wear clean clothes to the hospital/surgery center.  FAILURE TO FOLLOW THESE INSTRUCTIONS MAY RESULT IN THE CANCELLATION OF YOUR SURGERY  PATIENT SIGNATURE_________________________________  NURSE SIGNATURE__________________________________  ________________________________________________________________________

## 2020-07-04 ENCOUNTER — Encounter (HOSPITAL_COMMUNITY): Payer: Self-pay

## 2020-07-04 ENCOUNTER — Other Ambulatory Visit: Payer: Self-pay

## 2020-07-04 ENCOUNTER — Encounter (HOSPITAL_COMMUNITY)
Admission: RE | Admit: 2020-07-04 | Discharge: 2020-07-04 | Disposition: A | Discharge status: Home / Self Care | Payer: Medicare HMO | Source: Ambulatory Visit | Attending: Urology | Admitting: Urology

## 2020-07-04 DIAGNOSIS — Z01818 Encounter for other preprocedural examination: Secondary | ICD-10-CM | POA: Insufficient documentation

## 2020-07-04 HISTORY — DX: Unspecified osteoarthritis, unspecified site: M19.90

## 2020-07-04 HISTORY — DX: Type 2 diabetes mellitus without complications: E11.9

## 2020-07-04 HISTORY — DX: Anemia, unspecified: D64.9

## 2020-07-04 HISTORY — DX: Dyspnea, unspecified: R06.00

## 2020-07-04 HISTORY — DX: Essential (primary) hypertension: I10

## 2020-07-04 HISTORY — DX: Personal history of urinary calculi: Z87.442

## 2020-07-04 LAB — BASIC METABOLIC PANEL
Anion gap: 4 — ABNORMAL LOW (ref 5–15)
BUN: 15 mg/dL (ref 8–23)
CO2: 22 mmol/L (ref 22–32)
Calcium: 8.8 mg/dL — ABNORMAL LOW (ref 8.9–10.3)
Chloride: 114 mmol/L — ABNORMAL HIGH (ref 98–111)
Creatinine, Ser: 0.89 mg/dL (ref 0.44–1.00)
GFR, Estimated: >60 mL/min (ref >60)
Glucose, Bld: 85 mg/dL (ref 70–99)
Potassium: 4.2 mmol/L (ref 3.5–5.1)
Sodium: 140 mmol/L (ref 135–145)

## 2020-07-04 LAB — CBC
HCT: 38.7 % (ref 36.0–46.0)
Hemoglobin: 11.3 g/dL — ABNORMAL LOW (ref 12.0–15.0)
MCH: 25.9 pg — ABNORMAL LOW (ref 26.0–34.0)
MCHC: 29.2 g/dL — ABNORMAL LOW (ref 30.0–36.0)
MCV: 88.6 fL (ref 80.0–100.0)
Platelets: 276 10*3/uL (ref 150–400)
RBC: 4.37 MIL/uL (ref 3.87–5.11)
RDW: 13.7 % (ref 11.5–15.5)
WBC: 5.8 10*3/uL (ref 4.0–10.5)
nRBC: 0 % (ref 0.0–0.2)

## 2020-07-04 LAB — GLUCOSE, CAPILLARY: Glucose-Capillary: 90 mg/dL (ref 70–99)

## 2020-07-05 ENCOUNTER — Ambulatory Visit (HOSPITAL_COMMUNITY): Payer: Medicare HMO | Admitting: Anesthesiology

## 2020-07-05 ENCOUNTER — Encounter (HOSPITAL_COMMUNITY)
Admission: RE | Disposition: A | Discharge status: Home / Self Care | Payer: Self-pay | Source: Home / Self Care | Attending: Urology

## 2020-07-05 ENCOUNTER — Ambulatory Visit (HOSPITAL_COMMUNITY): Payer: Medicare HMO

## 2020-07-05 ENCOUNTER — Encounter (HOSPITAL_COMMUNITY): Payer: Self-pay | Admitting: Urology

## 2020-07-05 ENCOUNTER — Ambulatory Visit (HOSPITAL_COMMUNITY)
Admission: RE | Admit: 2020-07-05 | Discharge: 2020-07-05 | Disposition: A | Discharge status: Home / Self Care | Payer: Medicare HMO | Attending: Urology | Admitting: Urology

## 2020-07-05 DIAGNOSIS — Z79899 Other long term (current) drug therapy: Secondary | ICD-10-CM | POA: Diagnosis not present

## 2020-07-05 DIAGNOSIS — Z9884 Bariatric surgery status: Secondary | ICD-10-CM | POA: Insufficient documentation

## 2020-07-05 DIAGNOSIS — Z79891 Long term (current) use of opiate analgesic: Secondary | ICD-10-CM | POA: Insufficient documentation

## 2020-07-05 DIAGNOSIS — N200 Calculus of kidney: Secondary | ICD-10-CM

## 2020-07-05 DIAGNOSIS — E669 Obesity, unspecified: Secondary | ICD-10-CM | POA: Insufficient documentation

## 2020-07-05 DIAGNOSIS — Z6841 Body Mass Index (BMI) 40.0 and over, adult: Secondary | ICD-10-CM | POA: Insufficient documentation

## 2020-07-05 DIAGNOSIS — N201 Calculus of ureter: Secondary | ICD-10-CM | POA: Insufficient documentation

## 2020-07-05 DIAGNOSIS — Z791 Long term (current) use of non-steroidal anti-inflammatories (NSAID): Secondary | ICD-10-CM | POA: Diagnosis not present

## 2020-07-05 HISTORY — PX: CYSTOSCOPY/URETEROSCOPY/HOLMIUM LASER/STENT PLACEMENT: SHX6546

## 2020-07-05 LAB — HEMOGLOBIN A1C
Hgb A1c MFr Bld: 5.8 % — ABNORMAL HIGH (ref 4.8–5.6)
Mean Plasma Glucose: 120 mg/dL

## 2020-07-05 LAB — GLUCOSE, CAPILLARY
Glucose-Capillary: 87 mg/dL (ref 70–99)
Glucose-Capillary: 107 mg/dL — ABNORMAL HIGH (ref 70–99)

## 2020-07-05 SURGERY — CYSTOSCOPY/URETEROSCOPY/HOLMIUM LASER/STENT PLACEMENT
Anesthesia: General | Laterality: Left

## 2020-07-05 MED ORDER — TRAMADOL HCL 50 MG PO TABS: 50.0000 mg | ORAL_TABLET | Freq: Four times a day (QID) | ORAL | 0 refills | Status: DC | PRN

## 2020-07-05 MED ORDER — LIDOCAINE HCL URETHRAL/MUCOSAL 2 % EX GEL
CUTANEOUS | Status: AC
  Filled 2020-07-05: qty 30

## 2020-07-05 MED ORDER — LIDOCAINE 2% (20 MG/ML) 5 ML SYRINGE
INTRAMUSCULAR | Status: AC
  Filled 2020-07-05: qty 5

## 2020-07-05 MED ORDER — OXYCODONE HCL 5 MG/5ML PO SOLN: 5.0000 mg | Freq: Once | ORAL | Status: DC | PRN

## 2020-07-05 MED ORDER — EPHEDRINE 5 MG/ML INJ
INTRAVENOUS | Status: AC
  Filled 2020-07-05: qty 10

## 2020-07-05 MED ORDER — PROPOFOL 10 MG/ML IV BOLUS
INTRAVENOUS | Status: DC | PRN
  Administered 2020-07-05: 200 mg via INTRAVENOUS

## 2020-07-05 MED ORDER — OXYCODONE HCL 5 MG PO TABS: 5.0000 mg | ORAL_TABLET | Freq: Once | ORAL | Status: DC | PRN

## 2020-07-05 MED ORDER — ONDANSETRON HCL 4 MG/2ML IJ SOLN
INTRAMUSCULAR | Status: DC | PRN
  Administered 2020-07-05: 4 mg via INTRAVENOUS

## 2020-07-05 MED ORDER — CEFAZOLIN IN SODIUM CHLORIDE 3-0.9 GM/100ML-% IV SOLN
3.0000 g | INTRAVENOUS | Status: AC
  Administered 2020-07-05: 3 g via INTRAVENOUS
  Filled 2020-07-05: qty 100

## 2020-07-05 MED ORDER — DEXAMETHASONE SODIUM PHOSPHATE 10 MG/ML IJ SOLN
INTRAMUSCULAR | Status: AC
  Filled 2020-07-05: qty 1

## 2020-07-05 MED ORDER — ONDANSETRON HCL 4 MG/2ML IJ SOLN
INTRAMUSCULAR | Status: AC
  Filled 2020-07-05: qty 2

## 2020-07-05 MED ORDER — FENTANYL CITRATE (PF) 100 MCG/2ML IJ SOLN
INTRAMUSCULAR | Status: AC
  Filled 2020-07-05: qty 2

## 2020-07-05 MED ORDER — PROPOFOL 10 MG/ML IV BOLUS
INTRAVENOUS | Status: AC
  Filled 2020-07-05: qty 20

## 2020-07-05 MED ORDER — LIDOCAINE HCL (CARDIAC) PF 100 MG/5ML IV SOSY
PREFILLED_SYRINGE | INTRAVENOUS | Status: DC | PRN
  Administered 2020-07-05: 100 mg via INTRAVENOUS

## 2020-07-05 MED ORDER — ONDANSETRON HCL 4 MG/2ML IJ SOLN: 4.0000 mg | Freq: Once | INTRAMUSCULAR | Status: DC | PRN

## 2020-07-05 MED ORDER — EPHEDRINE SULFATE 50 MG/ML IJ SOLN
INTRAMUSCULAR | Status: DC | PRN
  Administered 2020-07-05: 5 mg via INTRAVENOUS

## 2020-07-05 MED ORDER — FENTANYL CITRATE (PF) 100 MCG/2ML IJ SOLN: 25.0000 ug | INTRAMUSCULAR | Status: DC | PRN

## 2020-07-05 MED ORDER — PHENAZOPYRIDINE HCL 200 MG PO TABS: 200.0000 mg | ORAL_TABLET | Freq: Three times a day (TID) | ORAL | 0 refills | Status: DC | PRN

## 2020-07-05 MED ORDER — SODIUM CHLORIDE 0.9 % IV SOLN
INTRAVENOUS | Status: DC | PRN
  Administered 2020-07-05: 10 mL

## 2020-07-05 MED ORDER — CHLORHEXIDINE GLUCONATE 0.12 % MT SOLN
15.0000 mL | Freq: Once | OROMUCOSAL | Status: AC
  Administered 2020-07-05: 15 mL via OROMUCOSAL

## 2020-07-05 MED ORDER — LACTATED RINGERS IV SOLN: INTRAVENOUS | Status: DC

## 2020-07-05 MED ORDER — FENTANYL CITRATE (PF) 100 MCG/2ML IJ SOLN
INTRAMUSCULAR | Status: DC | PRN
  Administered 2020-07-05 (×3): 25 ug via INTRAVENOUS
  Administered 2020-07-05: 50 ug via INTRAVENOUS
  Administered 2020-07-05: 25 ug via INTRAVENOUS

## 2020-07-05 MED ORDER — DEXAMETHASONE SODIUM PHOSPHATE 10 MG/ML IJ SOLN
INTRAMUSCULAR | Status: DC | PRN
  Administered 2020-07-05: 10 mg via INTRAVENOUS

## 2020-07-05 MED ORDER — ACETAMINOPHEN 10 MG/ML IV SOLN: 1000.0000 mg | Freq: Once | INTRAVENOUS | Status: DC | PRN

## 2020-07-05 MED ORDER — CIPROFLOXACIN HCL 500 MG PO TABS: 500.0000 mg | ORAL_TABLET | Freq: Once | ORAL | 0 refills | Status: AC

## 2020-07-05 MED ORDER — ORAL CARE MOUTH RINSE: 15.0000 mL | Freq: Once | OROMUCOSAL | Status: AC

## 2020-07-05 SURGICAL SUPPLY — 16 items
BAG URO CATCHER STRL LF (MISCELLANEOUS) ×3 IMPLANT
CATH URET 5FR 28IN OPEN ENDED (CATHETERS) ×3 IMPLANT
CATH URET FLEX-TIP 2 LUMEN 10F (CATHETERS) ×2 IMPLANT
CLOTH BEACON ORANGE TIMEOUT ST (SAFETY) ×3 IMPLANT
EXTRACTOR STONE NITINOL NGAGE (UROLOGICAL SUPPLIES) ×2 IMPLANT
FIBER LASER FLEXIVA 365 (UROLOGICAL SUPPLIES) ×2 IMPLANT
GLOVE SURG ENC TEXT LTX SZ7.5 (GLOVE) ×3 IMPLANT
GOWN STRL REUS W/TWL XL LVL3 (GOWN DISPOSABLE) ×3 IMPLANT
GUIDEWIRE STR DUAL SENSOR (WIRE) ×3 IMPLANT
KIT TURNOVER KIT A (KITS) ×3 IMPLANT
MANIFOLD NEPTUNE II (INSTRUMENTS) ×3 IMPLANT
PACK CYSTO (CUSTOM PROCEDURE TRAY) ×3 IMPLANT
STENT URET 6FRX24 CONTOUR (STENTS) ×2 IMPLANT
TUBING CONNECTING 10 (TUBING) ×2 IMPLANT
TUBING CONNECTING 10' (TUBING) ×1
TUBING UROLOGY SET (TUBING) ×3 IMPLANT

## 2020-07-05 NOTE — Anesthesia Procedure Notes (Addendum)
Procedure Name: LMA Insertion Date/Time: 07/05/2020 12:12 PM Performed by: Raenette Rover, CRNA Pre-anesthesia Checklist: Patient identified, Emergency Drugs available, Suction available, Patient being monitored and Timeout performed Patient Re-evaluated:Patient Re-evaluated prior to induction Oxygen Delivery Method: Circle system utilized Preoxygenation: Pre-oxygenation with 100% oxygen Induction Type: IV induction Ventilation: Mask ventilation without difficulty LMA: LMA with gastric port inserted LMA Size: 4.0 Number of attempts: 1 Placement Confirmation: positive ETCO2 and breath sounds checked- equal and bilateral Tube secured with: Tape Dental Injury: Teeth and Oropharynx as per pre-operative assessment  Comments: LMA by Charyl Bigger, SRNA

## 2020-07-05 NOTE — Op Note (Signed)
Preoperative diagnosis: left ureteral calculus  Postoperative diagnosis: left ureteral calculus  Procedure:  Cystoscopy left ureteroscopy and stone removal Ureteroscopic laser lithotripsy left 71F x 24 ureteral stent placement  left retrograde pyelography with interpretation  Surgeon: Ardis Hughs, MD  Anesthesia: General  Complications: None  Intraoperative findings: left retrograde pyelography demonstrated a filling defect within the left ureter consistent with the patient's known calculus without other abnormalities.  EBL: Minimal  Specimens: left ureteral calculus  Disposition of specimens: [facility] for stone analysis  Indication: Cynthia Calderon is a 70 y.o.   patient with a  left ureteral stone and associated left symptoms. After reviewing the management options for treatment, the patient elected to proceed with the above surgical procedure(s). We have discussed the potential benefits and risks of the procedure, side effects of the proposed treatment, the likelihood of the patient achieving the goals of the procedure, and any potential problems that might occur during the procedure or recuperation. Informed consent has been obtained.   Description of procedure:  The patient was taken to the operating room and general anesthesia was induced.  The patient was placed in the dorsal lithotomy position, prepped and draped in the usual sterile fashion, and preoperative antibiotics were administered. A preoperative time-out was performed.   Cystourethroscopy was performed.  The patient's urethra was examined and was normal. The bladder was then systematically examined in its entirety. There was no evidence for any bladder tumors, stones, or other mucosal pathology.    Attention then turned to the left ureteral orifice and a ureteral catheter was used to intubate the ureteral orifice.  Omnipaque contrast was injected through the ureteral catheter and a  retrograde pyelogram was performed with findings as dictated above.  A 0.38 sensor guidewire was then advanced up the left ureter into the renal pelvis under fluoroscopic guidance. The 6 Fr semirigid ureteroscope was then advanced into the ureter next to the guidewire and the calculus was identified.   The stone was then fragmented with the 365 micron holmium laser fiber on a setting of 2.0 and frequency of 20 Hz.   All stones were then removed from the ureter with an N-gage nitinol basket.  Reinspection of the ureter revealed no remaining visible stones or fragments.   The wire was then backloaded through the cystoscope and a ureteral stent was advance over the wire using Seldinger technique.  The stent was positioned appropriately under fluoroscopic and cystoscopic guidance.  The wire was then removed with an adequate stent curl noted in the renal pelvis as well as in the bladder.  The bladder was then emptied and the procedure ended.  The patient appeared to tolerate the procedure well and without complications.  The patient was able to be awakened and transferred to the recovery unit in satisfactory condition.   Disposition: The tether of the stent was left on and secured to the ventral aspect of the patient's penis. tucked inside the patient's vagina.  Instructions for removing the stent have been provided to the patient. The patient has been scheduled for followup in 6 weeks with a renal ultrasound.

## 2020-07-05 NOTE — Interval H&P Note (Signed)
History and Physical Interval Note:  07/05/2020 11:39 AM  Cynthia Calderon  has presented today for surgery, with the diagnosis of LEFT DISTAL STONE.  The various methods of treatment have been discussed with the patient and family. After consideration of risks, benefits and other options for treatment, the patient has consented to  Procedure(s): LEFT URETEROSCOPY/HOLMIUM LASER STONE EXTRACTION STENT PLACEMENT (Left) as a surgical intervention.  The patient's history has been reviewed, patient examined, no change in status, stable for surgery.  I have reviewed the patient's chart and labs.  Questions were answered to the patient's satisfaction.     Ardis Hughs

## 2020-07-05 NOTE — Anesthesia Postprocedure Evaluation (Signed)
Anesthesia Post Note  Patient: Milinda Antis  Procedure(s) Performed: LEFT URETEROSCOPY/HOLMIUM LASER STONE EXTRACTION STENT PLACEMENT (Left)     Patient location during evaluation: PACU Anesthesia Type: General Level of consciousness: awake and alert Pain management: pain level controlled Vital Signs Assessment: post-procedure vital signs reviewed and stable Respiratory status: spontaneous breathing, nonlabored ventilation, respiratory function stable and patient connected to nasal cannula oxygen Cardiovascular status: blood pressure returned to baseline and stable Postop Assessment: no apparent nausea or vomiting Anesthetic complications: no   No notable events documented.  Last Vitals:  Vitals:   07/05/20 1424 07/05/20 1447  BP: (!) 123/93 (!) 159/86  Pulse: 69 65  Resp: 16 16  Temp: 36.9 C 36.8 C  SpO2: 98% 100%    Last Pain:  Vitals:   07/05/20 1447  TempSrc:   PainSc: 0-No pain                 Ichael Pullara S

## 2020-07-05 NOTE — Discharge Instructions (Signed)
DISCHARGE INSTRUCTIONS FOR KIDNEY STONE/URETERAL STENT   MEDICATIONS:  1.  Resume all your other meds from home - except do not take any extra narcotic pain meds that you may have at home.  2. Pyridium is to help with the burning/stinging when you urinate. 3. Tramadol is for moderate/severe pain, otherwise taking upto 1000 mg every 6 hours of plainTylenol will help treat your pain.   4. Take Cipro one hour prior to removal of your stent.   ACTIVITY:  1. No strenuous activity x 1week  2. No driving while on narcotic pain medications  3. Drink plenty of water  4. Continue to walk at home - you can still get blood clots when you are at home, so keep active, but don't over do it.  5. May return to work/school tomorrow or when you feel ready   BATHING:  1. You can shower and we recommend daily showers  2. You have a string coming from your urethra: The stent string is attached to your ureteral stent. Do not pull on this.   SIGNS/SYMPTOMS TO CALL:  Please call us if you have a fever greater than 101.5, uncontrolled nausea/vomiting, uncontrolled pain, dizziness, unable to urinate, bloody urine, chest pain, shortness of breath, leg swelling, leg pain, redness around wound, drainage from wound, or any other concerns or questions.   You can reach [place] at [phone].   FOLLOW-UP:  1. You have an appointment in 6 weeks with a ultrasound of your kidneys prior.   2. You have a string attached to your stent, you may remove it on July 5th. To do this, pull the strings until the stents are completely removed. You may feel an odd sensation in your back.

## 2020-07-05 NOTE — Transfer of Care (Signed)
Immediate Anesthesia Transfer of Care Note  Patient: Cynthia Calderon  Procedure(s) Performed: LEFT URETEROSCOPY/HOLMIUM LASER STONE EXTRACTION STENT PLACEMENT (Left)  Patient Location: PACU  Anesthesia Type:General  Level of Consciousness: awake, alert , oriented and patient cooperative  Airway & Oxygen Therapy: Patient Spontanous Breathing and Patient connected to face mask oxygen  Post-op Assessment: Report given to RN and Post -op Vital signs reviewed and stable--upon admission to PACU, pt appeared either in afib or ST with a rate between 100s-130. BP stable and pt mentating appropriately. P waves occasionally visible, but not consistent. MDA called to make aware. HR appears to have recovered around 1325 to SB around 50s, NSR to 60s.  Post vital signs: Reviewed and stable  Last Vitals:  Vitals Value Taken Time  BP 139/94 07/05/20 1318  Temp    Pulse 98 07/05/20 1324  Resp 15 07/05/20 1324  SpO2 100 % 07/05/20 1324  Vitals shown include unvalidated device data.  Last Pain:  Vitals:   07/05/20 1317  TempSrc:   PainSc: 0-No pain         Complications: No notable events documented.

## 2020-07-05 NOTE — Anesthesia Preprocedure Evaluation (Addendum)
Anesthesia Evaluation  Patient identified by MRN, date of birth, ID band Patient awake    Reviewed: Allergy & Precautions, NPO status , Patient's Chart, lab work & pertinent test results  Airway Mallampati: II  TM Distance: <3 FB Neck ROM: Full    Dental no notable dental hx.    Pulmonary asthma ,    Pulmonary exam normal breath sounds clear to auscultation       Cardiovascular hypertension, Normal cardiovascular exam Rhythm:Regular Rate:Normal     Neuro/Psych negative neurological ROS  negative psych ROS   GI/Hepatic negative GI ROS, Neg liver ROS,   Endo/Other  diabetesMorbid obesity  Renal/GU negative Renal ROS  negative genitourinary   Musculoskeletal negative musculoskeletal ROS (+)   Abdominal (+) + obese,   Peds negative pediatric ROS (+)  Hematology negative hematology ROS (+)   Anesthesia Other Findings   Reproductive/Obstetrics negative OB ROS                             Anesthesia Physical Anesthesia Plan  ASA: 3  Anesthesia Plan: General   Post-op Pain Management:    Induction: Intravenous  PONV Risk Score and Plan: 3 and Ondansetron, Dexamethasone and Treatment may vary due to age or medical condition  Airway Management Planned: LMA  Additional Equipment:   Intra-op Plan:   Post-operative Plan: Extubation in OR  Informed Consent: I have reviewed the patients History and Physical, chart, labs and discussed the procedure including the risks, benefits and alternatives for the proposed anesthesia with the patient or authorized representative who has indicated his/her understanding and acceptance.     Dental advisory given  Plan Discussed with: CRNA and Surgeon  Anesthesia Plan Comments:         Anesthesia Quick Evaluation

## 2020-07-05 NOTE — H&P (Signed)
Acute Kidney Stone  HPI: Cynthia Calderon is a 70 year-old female patient who is here for further eval and management of kidney stones.  She was diagnosed with a kidney stone on approximately 05/25/2020. The patient presented to [facility] with symptoms of a kidney stone.   Her pain started about approximately 05/25/2020. The pain is on the left side.   Abdomen/Pelvic CT: 05/24/20 - 52mm. The patient underwent CT scan prior to today's appointment.   The patient relates initially having nausea, vomiting, flank pain, and voiding symptoms. She is currently having irritative voiding symptoms. She denies having flank pain, back pain, groin pain, nausea, vomiting, fever, and chills. She has not caught a stone in her urine strainer since her symptoms began.   She has never had surgical treatment for calculi in the past. This is her first kidney stone.   No pain currently.      ALLERGIES: None   MEDICATIONS: Lisinopril 2.5 mg tablet  Omeprazole 20 mg capsule,delayed release  Percocet  Tamsulosin Hcl 0.4 mg capsule  Fish Oil  Flonase Allergy Relief  Meloxicam 7.5 mg tablet  Multivitamin  Potassium  Proventil Hfa 90 mcg hfa aerosol with adapter  Singulair  Tramadol Hcl 50 mg tablet  Trazodone Hcl 100 mg tablet  Turmeric  Tylenol  Vitamin B12  Vitamin C  Vitamin D2 1,250 mcg (50,000 unit) capsule  Zofran     GU PSH: None   NON-GU PSH: Gastric bypass, 1983     GU PMH: None   NON-GU PMH: Hypertension Obesity    FAMILY HISTORY: 2 sons - Son Heart Disease - Runs in Family liver disease - Sister   SOCIAL HISTORY: Marital Status: Widowed Preferred Language: English; Ethnicity: Not Hispanic Or Latino; Race: Black or African American Current Smoking Status: Patient has never smoked.   Tobacco Use Assessment Completed: Used Tobacco in last 30 days? Has never drank.  Drinks 4+ caffeinated drinks per day.    REVIEW OF SYSTEMS:    GU Review Female:   Patient reports get up at  night to urinate, leakage of urine, and stream starts and stops. Patient denies frequent urination, hard to postpone urination, burning /pain with urination, trouble starting your stream, have to strain to urinate, and being pregnant.  Gastrointestinal (Upper):   Patient denies nausea, vomiting, and indigestion/ heartburn.  Gastrointestinal (Lower):   Patient denies diarrhea and constipation.  Constitutional:   Patient denies fever, night sweats, weight loss, and fatigue.  Skin:   Patient denies skin rash/ lesion and itching.  Eyes:   Patient denies blurred vision and double vision.  Ears/ Nose/ Throat:   Patient denies sore throat and sinus problems.  Hematologic/Lymphatic:   Patient denies swollen glands and easy bruising.  Cardiovascular:   Patient denies leg swelling and chest pains.  Respiratory:   Patient denies cough and shortness of breath.  Endocrine:   Patient denies excessive thirst.  Musculoskeletal:   Patient denies back pain and joint pain.  Neurological:   Patient denies headaches and dizziness.  Psychologic:   Patient denies depression and anxiety.   VITAL SIGNS:      06/08/2020 07:57 AM  Weight 320 lb / 145.15 kg  Height 65 in / 165.1 cm  BP 142/81 mmHg  Pulse 57 /min  Temperature 97.7 F / 36.5 C  BMI 53.2 kg/m   MULTI-SYSTEM PHYSICAL EXAMINATION:    Constitutional: Well-nourished. No physical deformities. Normally developed. Good grooming.  Neck: Neck symmetrical, not swollen. Normal tracheal position.  Respiratory:  No labored breathing, no use of accessory muscles.   Cardiovascular: Normal temperature, normal extremity pulses, no swelling, no varicosities.  Lymphatic: No enlargement of neck, axillae, groin.  Skin: No paleness, no jaundice, no cyanosis. No lesion, no ulcer, no rash.  Neurologic / Psychiatric: Oriented to time, oriented to place, oriented to person. No depression, no anxiety, no agitation.  Gastrointestinal: No mass, no tenderness, no rigidity, non  obese abdomen.  Eyes: Normal conjunctivae. Normal eyelids.  Ears, Nose, Mouth, and Throat: Left ear no scars, no lesions, no masses. Right ear no scars, no lesions, no masses. Nose no scars, no lesions, no masses. Normal hearing. Normal lips.  Musculoskeletal: Normal gait and station of head and neck.     Complexity of Data:  Source Of History:  Patient  Records Review:   Previous Doctor Records, Previous Patient Records, POC Tool  Urine Test Review:   Urinalysis  X-Ray Review: C.T. Abdomen/Pelvis: Reviewed Films. Discussed With Patient.     PROCEDURES:          Urinalysis w/Scope Dipstick Dipstick Cont'd Micro  Color: Straw Bilirubin: Neg mg/dL WBC/hpf: NS (Not Seen)  Appearance: Clear Ketones: Neg mg/dL RBC/hpf: 0 - 2/hpf  Specific Gravity: 1.020 Blood: Trace ery/uL Bacteria: Rare (0-9/hpf)  pH: 5.5 Protein: Trace mg/dL Cystals: NS (Not Seen)  Glucose: Neg mg/dL Urobilinogen: 0.2 mg/dL Casts: NS (Not Seen)    Nitrites: Neg Trichomonas: Not Present    Leukocyte Esterase: Neg leu/uL Mucous: Not Present      Epithelial Cells: 0 - 5/hpf      Yeast: NS (Not Seen)      Sperm: Not Present    ASSESSMENT:      ICD-10 Details  1 GU:   Ureteral calculus - N20.1    PLAN:           Schedule Return Visit/Planned Activity: ASAP - Schedule Surgery          Document Letter(s):  Created for Patient: Clinical Summary    I went over the treatment options for their stone. We discussed ongoing medical expulsion therapy, ESWL and ureteroscopy. Ultimately the patient and I agreed that ureterscopy is the best option. I went over this surgery with the patient in detail. The patient understands after being put to sleep, we would proceed with a telescope to access the stone and potentially use a laser to fragment the stone before removing it with a basket. After removing the stone the patient will require temporary stent placement in the ureter. This is an outpatient procedure. I also discussed the  potential of not being able to gain access safely into the ureter/kidney. This would require that a stent be placed and then the patient rescheduled several weeks later for a second attempt. They also understand the small risks of ureteral trauma causing a stricture or permanent damage. I also explained the risk of urinary tract infection. Having gone over the procedure itself, the expected outcome, and the risks/benefits the patient has agreed to proceed.        Notes:   Plan to schedule this patient for ureteroscopy and distal stone removal. I will get this scheduled at 1st available. I did give the patient some return precautions. She will call us if she develops pain in the interim.

## 2020-07-06 ENCOUNTER — Encounter (HOSPITAL_COMMUNITY): Payer: Self-pay | Admitting: Urology

## 2020-07-12 LAB — CALCULI, WITH PHOTOGRAPH (CLINICAL LAB)
Calcium Oxalate Monohydrate: 100 %
Weight Calculi: 295 mg

## 2021-01-31 NOTE — Congregational Nurse Program (Signed)
°  Dept: [phone]   Congregational Nurse Program Note  Date of Encounter: 01/31/2021  Past Medical History: Past Medical History:  Diagnosis Date   Anemia    Arthritis    Bronchial asthma    Last Spring   Diabetes mellitus without complication ([place])    Dyspnea    History of kidney stones    Hypertension     Encounter Detail No complaints or concerns. BP 145/74 P 56 Erma Heritage RN

## 2021-02-14 NOTE — Congregational Nurse Program (Signed)
C/O pain in right shoulder;  Scheduled for MRI on Friday °BP 145/74 P53 °  RN °

## 2021-02-14 NOTE — Congregational Nurse Program (Signed)
No problems or complaints. BP 145/74 Stated she has started An exercise .program Erma Heritage RN

## 2021-03-04 NOTE — Congregational Nurse Program (Signed)
Stated this has been a stressful day busy busy. No complaints of pain or discomfort BP 179/92 P69 Random blood sugar 117. Erma Heritage RN

## 2021-03-11 ENCOUNTER — Other Ambulatory Visit (HOSPITAL_COMMUNITY): Payer: Self-pay | Admitting: Family Medicine

## 2021-03-11 DIAGNOSIS — Z1231 Encounter for screening mammogram for malignant neoplasm of breast: Secondary | ICD-10-CM

## 2021-03-29 ENCOUNTER — Ambulatory Visit (HOSPITAL_COMMUNITY)
Admission: RE | Admit: 2021-03-29 | Discharge: 2021-03-29 | Disposition: A | Discharge status: Home / Self Care | Payer: Medicare HMO | Source: Ambulatory Visit | Attending: Family Medicine | Admitting: Family Medicine

## 2021-03-29 ENCOUNTER — Other Ambulatory Visit: Payer: Self-pay

## 2021-03-29 DIAGNOSIS — Z1231 Encounter for screening mammogram for malignant neoplasm of breast: Secondary | ICD-10-CM | POA: Diagnosis present

## 2021-04-01 NOTE — Congregational Nurse Program (Signed)
?  Dept: [phone] ?Stated  ?has had a hectic day BP 142/85 P 64 ?Congregational Nurse Program Note ?Erma Heritage RN ?Date of Encounter: 04/01/2021 ? ?Past Medical History: ?Past Medical History:  ?Diagnosis Date  ? Anemia   ? Arthritis   ? Bronchial asthma   ? Last Spring  ? Diabetes mellitus without complication ([place])   ? Dyspnea   ? History of kidney stones   ? Hypertension   ? ? ?Encounter Details: ? CNP Questionnaire - 03/12/21 1750   ? ?  ? Questionnaire  ? Do you give verbal consent to treat you today? Yes   ? Location Patient Served  Home of [place], [place]   ? Visit Setting Church or Organization   ? Patient Status Unknown   ? Insurance Medicare;Private or [place]   ? Insurance Referral N/A   ? Medication N/A   ? Medical Provider Yes   ? Screening Referrals N/A   ? Medical Referral N/A   ? Medical Appointment Made N/A   ? Food N/A   ? Transportation N/A   ? Housing/Utilities N/A   ? Interpersonal Safety N/A   ? Intervention Blood pressure   ? ED Visit Averted N/A   ? Life-Saving Intervention Made N/A   ? ?  ?  ? ?  ? ? ? ? ?

## 2021-05-08 NOTE — Congregational Nurse Program (Signed)
?  Dept: [phone] ? ? ?Congregational Nurse Program Note ? ?Date of Encounter: 05/08/2021 ? ?Past Medical History: ?Past Medical History:  ?Diagnosis Date  ? Anemia   ? Arthritis   ? Bronchial asthma   ? Last Spring  ? Diabetes mellitus without complication ([place])   ? Dyspnea   ? History of kidney stones   ? Hypertension   ? ? ?Encounter Details: ? CNP Questionnaire - 04/20/21 1745   ? ?  ? Questionnaire  ? Do you give verbal consent to treat you today? Yes   ? Location Patient Served  Home of [place], [place]   ? Visit Setting Church or Organization   ? Patient Status Unknown   ? Insurance Medicare;Private or [place]   ? Insurance Referral N/A   ? Medication N/A   ? Medical Provider Yes   ? Screening Referrals N/A   ? Medical Referral N/A   ? Medical Appointment Made N/A   ? Food N/A   ? Transportation N/A   ? Housing/Utilities N/A   ? Interpersonal Safety N/A   ? Intervention Blood pressure   ? ED Visit Averted N/A   ? Life-Saving Intervention Made N/A   ? ?  ?  ? ?  ? ?"I"m doing okay I guess", stated patient. No complaints or health concerns. ?BP 133/78 P 66    Erma Heritage RN ? ? ?

## 2021-05-08 NOTE — Congregational Nurse Program (Signed)
No complaints or concerns. Stated she saw her PCP on yesterday and got a good report. BP 148/82 p 60  Erma Heritage RN ?

## 2021-05-12 NOTE — Congregational Nurse Program (Signed)
?  Dept: [phone] ? ? ?Congregational Nurse Program Note ? ?Date of Encounter: 05/12/2021 ? ?Past Medical History: ?Past Medical History:  ?Diagnosis Date  ? Anemia   ? Arthritis   ? Bronchial asthma   ? Last Spring  ? Diabetes mellitus without complication ([place])   ? Dyspnea   ? History of kidney stones   ? Hypertension   ? ? ?Encounter Details: ? CNP Questionnaire - 04/30/21 1810   ? ?  ? Questionnaire  ? Do you give verbal consent to treat you today? Yes   ? Location Patient Served  Home of [place], [place]   ? Visit Setting Church or Organization   ? Patient Status Unknown   ? Insurance Medicare;Private or [place]   ? Insurance Referral N/A   ? Medication N/A   ? Medical Provider Yes   ? Screening Referrals N/A   ? Medical Referral N/A   ? Medical Appointment Made N/A   ? Food N/A   ? Transportation N/A   ? Housing/Utilities N/A   ? Interpersonal Safety N/A   ? Intervention Blood pressure   ? ED Visit Averted N/A   ? Life-Saving Intervention Made N/A   ? ?  ?  ? ?  ? No problems or concerns. BP162/82  Pulse 61 ?Erma Heritage RN ? ? ? ?

## 2021-07-26 ENCOUNTER — Encounter (INDEPENDENT_AMBULATORY_CARE_PROVIDER_SITE_OTHER): Payer: Self-pay | Admitting: *Deleted

## 2021-09-05 ENCOUNTER — Other Ambulatory Visit (INDEPENDENT_AMBULATORY_CARE_PROVIDER_SITE_OTHER): Payer: Self-pay

## 2021-09-05 DIAGNOSIS — Z1211 Encounter for screening for malignant neoplasm of colon: Secondary | ICD-10-CM

## 2021-09-30 ENCOUNTER — Encounter (INDEPENDENT_AMBULATORY_CARE_PROVIDER_SITE_OTHER): Payer: Self-pay

## 2021-09-30 ENCOUNTER — Telehealth (INDEPENDENT_AMBULATORY_CARE_PROVIDER_SITE_OTHER): Payer: Self-pay

## 2021-09-30 MED ORDER — PEG 3350-KCL-NA BICARB-NACL 420 G PO SOLR: 4000.0000 mL | ORAL | 0 refills | Status: DC

## 2021-09-30 NOTE — Telephone Encounter (Signed)
Skyler Dusing Ann Illana Nolting, CMA  ?

## 2021-09-30 NOTE — Telephone Encounter (Signed)
Referring MD/PCP: Dr Oswald Hillock  Procedure: tcs   Reason/Indication:  Screening   Has patient had this procedure before?  Yes   If so, when, by whom and where?    Is there a family history of colon cancer?  No   Who?  What age when diagnosed?    Is patient diabetic? If yes, Type 1 or Type 2   no      Does patient have prosthetic heart valve or mechanical valve?  No   Do you have a pacemaker/defibrillator?  no  Has patient ever had endocarditis/atrial fibrillation? no  Does patient use oxygen? No   Has patient had joint replacement within last 12 months?  No   Is patient constipated or do they take laxatives? No   Does patient have a history of alcohol/drug use?  No   Have you had a stroke/heart attack last 6 mths? No   Do you take medicine for weight loss?  No   For female patients,: have you had a hysterectomy No                      are you post menopausal Yes                      do you still have your menstrual cycle No   Is patient on blood thinner such as Coumadin, Plavix and/or Aspirin? No   Medications: Montelukast 10 mg daily, Ondansetron 40 mg daily, Tamsulosin 0.4 mg cap daily, oxycod/acetamin 5/325 daily, ciprofloaxin 50 mg, phenazopyrid 200 mg, phenazopyrid 200 mg , tramadol 40 mg daily, albuterol HFA 90 mcg daily, methylpred 4 mg pak, promethazine dm syrup, cyclobenzapar 5 mg, methylpred 4 mg pak, lidocaine patch 5%  Allergies: NKDA  Medication Adjustment per Dr Jenetta Downer none   Procedure date & time: 10/29/21 at 9:15

## 2021-10-23 NOTE — Patient Instructions (Signed)
Lavaya Defreitas  10/23/2021     '@PREFPERIOPPHARMACY'$ @   Your procedure is scheduled on  10/29/2021.   Report to [facility] at  0700  A.M.   Call this number if you have problems the morning of surgery:  [phone]  If you experience any cold or flu symptoms such as cough, fever, chills, shortness of breath, etc. between now and your scheduled surgery, please notify us at the above number.   Remember:  Follow the diet and prep instructions given to you by the office.        Use your inhaler before you come and bring your rescue inhaler with you.     Take these medicines the morning of surgery with A SIP OF WATER                    meloxicam, omeprazole, pyridium, tramadol (if needed).     Do not wear jewelry, make-up or nail polish.  Do not wear lotions, powders, or perfumes, or deodorant.  Do not shave 48 hours prior to surgery.  Men may shave face and neck.  Do not bring valuables to the hospital.  [facility] is not responsible for any belongings or valuables.  Contacts, dentures or bridgework may not be worn into surgery.  Leave your suitcase in the car.  After surgery it may be brought to your room.  For patients admitted to the hospital, discharge time will be determined by your treatment team.  Patients discharged the day of surgery will not be allowed to drive home and must have someone with them for 24 hours.    Special instructions:   DO NOT smoke tobacco or vape for 24 hours before your procedure.  Please read over the following fact sheets that you were given. Anesthesia Post-op Instructions and Care and Recovery After Surgery      Colonoscopy, Adult, Care After The following information offers guidance on how to care for yourself after your procedure. Your health care provider may also give you more specific instructions. If you have problems or questions, contact your health care provider. What can I expect after the procedure? After the  procedure, it is common to have: A small amount of blood in your stool for 24 hours after the procedure. Some gas. Mild cramping or bloating of your abdomen. Follow these instructions at home: Eating and drinking  Drink enough fluid to keep your urine pale yellow. Follow instructions from your health care provider about eating or drinking restrictions. Resume your normal diet as told by your health care provider. Avoid heavy or fried foods that are hard to digest. Activity Rest as told by your health care provider. Avoid sitting for a long time without moving. Get up to take short walks every 1-2 hours. This is important to improve blood flow and breathing. Ask for help if you feel weak or unsteady. Return to your normal activities as told by your health care provider. Ask your health care provider what activities are safe for you. Managing cramping and bloating  Try walking around when you have cramps or feel bloated. If directed, apply heat to your abdomen as told by your health care provider. Use the heat source that your health care provider recommends, such as a moist heat pack or a heating pad. Place a towel between your skin and the heat source. Leave the heat on for 20-30 minutes. Remove the heat if your skin turns bright red. This  is especially important if you are unable to feel pain, heat, or cold. You have a greater risk of getting burned. General instructions If you were given a sedative during the procedure, it can affect you for several hours. Do not drive or operate machinery until your health care provider says that it is safe. For the first 24 hours after the procedure: Do not sign important documents. Do not drink alcohol. Do your regular daily activities at a slower pace than normal. Eat soft foods that are easy to digest. Take over-the-counter and prescription medicines only as told by your health care provider. Keep all follow-up visits. This is important. Contact  a health care provider if: You have blood in your stool 2-3 days after the procedure. Get help right away if: You have more than a small spotting of blood in your stool. You have large blood clots in your stool. You have swelling of your abdomen. You have nausea or vomiting. You have a fever. You have increasing pain in your abdomen that is not relieved with medicine. These symptoms may be an emergency. Get help right away. Call 911. Do not wait to see if the symptoms will go away. Do not drive yourself to the hospital. Summary After the procedure, it is common to have a small amount of blood in your stool. You may also have mild cramping and bloating of your abdomen. If you were given a sedative during the procedure, it can affect you for several hours. Do not drive or operate machinery until your health care provider says that it is safe. Get help right away if you have a lot of blood in your stool, nausea or vomiting, a fever, or increased pain in your abdomen. This information is not intended to replace advice given to you by your health care provider. Make sure you discuss any questions you have with your health care provider. Document Revised: 08/15/2020 Document Reviewed: 08/15/2020 Elsevier Patient Education  [place] After This sheet gives you information about how to care for yourself after your procedure. Your health care provider may also give you more specific instructions. If you have problems or questions, contact your health care provider. What can I expect after the procedure? After the procedure, it is common to have: Tiredness. Forgetfulness about what happened after the procedure. Impaired judgment for important decisions. Nausea or vomiting. Some difficulty with balance. Follow these instructions at home: For the time period you were told by your health care provider:     Rest as needed. Do not participate in activities  where you could fall or become injured. Do not drive or use machinery. Do not drink alcohol. Do not take sleeping pills or medicines that cause drowsiness. Do not make important decisions or sign legal documents. Do not take care of children on your own. Eating and drinking Follow the diet that is recommended by your health care provider. Drink enough fluid to keep your urine pale yellow. If you vomit: Drink water, juice, or soup when you can drink without vomiting. Make sure you have little or no nausea before eating solid foods. General instructions Have a responsible adult stay with you for the time you are told. It is important to have someone help care for you until you are awake and alert. Take over-the-counter and prescription medicines only as told by your health care provider. If you have sleep apnea, surgery and certain medicines can increase your risk for breathing problems. Follow  instructions from your health care provider about wearing your sleep device: Anytime you are sleeping, including during daytime naps. While taking prescription pain medicines, sleeping medicines, or medicines that make you drowsy. Avoid smoking. Keep all follow-up visits as told by your health care provider. This is important. Contact a health care provider if: You keep feeling nauseous or you keep vomiting. You feel light-headed. You are still sleepy or having trouble with balance after 24 hours. You develop a rash. You have a fever. You have redness or swelling around the IV site. Get help right away if: You have trouble breathing. You have new-onset confusion at home. Summary For several hours after your procedure, you may feel tired. You may also be forgetful and have poor judgment. Have a responsible adult stay with you for the time you are told. It is important to have someone help care for you until you are awake and alert. Rest as told. Do not drive or operate machinery. Do not drink  alcohol or take sleeping pills. Get help right away if you have trouble breathing, or if you suddenly become confused. This information is not intended to replace advice given to you by your health care provider. Make sure you discuss any questions you have with your health care provider. Document Revised: 11/27/2020 Document Reviewed: 11/25/2018 Elsevier Patient Education  [place].

## 2021-10-24 ENCOUNTER — Encounter (HOSPITAL_COMMUNITY)
Admission: RE | Admit: 2021-10-24 | Discharge: 2021-10-24 | Disposition: A | Discharge status: Home / Self Care | Payer: Medicare HMO | Source: Ambulatory Visit | Attending: Gastroenterology | Admitting: Gastroenterology

## 2021-10-24 ENCOUNTER — Encounter (HOSPITAL_COMMUNITY): Payer: Self-pay

## 2021-10-24 VITALS — BP 157/51 | HR 58 | Temp 98.4°F | Resp 18 | Ht 65.0 in | Wt 298.5 lb

## 2021-10-24 DIAGNOSIS — D649 Anemia, unspecified: Secondary | ICD-10-CM | POA: Diagnosis not present

## 2021-10-24 DIAGNOSIS — E119 Type 2 diabetes mellitus without complications: Secondary | ICD-10-CM | POA: Diagnosis not present

## 2021-10-24 DIAGNOSIS — I10 Essential (primary) hypertension: Secondary | ICD-10-CM | POA: Diagnosis not present

## 2021-10-24 DIAGNOSIS — Z01818 Encounter for other preprocedural examination: Secondary | ICD-10-CM | POA: Diagnosis present

## 2021-10-24 LAB — CBC WITH DIFFERENTIAL/PLATELET
Abs Immature Granulocytes: 0.01 10*3/uL (ref 0.00–0.07)
Basophils Absolute: 0 10*3/uL (ref 0.0–0.1)
Basophils Relative: 1 %
Eosinophils Absolute: 0.2 10*3/uL (ref 0.0–0.5)
Eosinophils Relative: 4 %
HCT: 37.9 % (ref 36.0–46.0)
Hemoglobin: 11.5 g/dL — ABNORMAL LOW (ref 12.0–15.0)
Immature Granulocytes: 0 %
Lymphocytes Relative: 34 %
Lymphs Abs: 1.9 10*3/uL (ref 0.7–4.0)
MCH: 26.7 pg (ref 26.0–34.0)
MCHC: 30.3 g/dL (ref 30.0–36.0)
MCV: 87.9 fL (ref 80.0–100.0)
Monocytes Absolute: 0.5 10*3/uL (ref 0.1–1.0)
Monocytes Relative: 8 %
Neutro Abs: 2.9 10*3/uL (ref 1.7–7.7)
Neutrophils Relative %: 53 %
Platelets: 306 10*3/uL (ref 150–400)
RBC: 4.31 MIL/uL (ref 3.87–5.11)
RDW: 14.5 % (ref 11.5–15.5)
WBC: 5.5 10*3/uL (ref 4.0–10.5)
nRBC: 0 % (ref 0.0–0.2)

## 2021-10-24 LAB — BASIC METABOLIC PANEL
Anion gap: 6 (ref 5–15)
BUN: 13 mg/dL (ref 8–23)
CO2: 25 mmol/L (ref 22–32)
Calcium: 9 mg/dL (ref 8.9–10.3)
Chloride: 109 mmol/L (ref 98–111)
Creatinine, Ser: 1.07 mg/dL — ABNORMAL HIGH (ref 0.44–1.00)
GFR, Estimated: 56 mL/min — ABNORMAL LOW (ref >60)
Glucose, Bld: 69 mg/dL — ABNORMAL LOW (ref 70–99)
Potassium: 4.2 mmol/L (ref 3.5–5.1)
Sodium: 140 mmol/L (ref 135–145)

## 2021-10-29 ENCOUNTER — Encounter (HOSPITAL_COMMUNITY): Payer: Self-pay | Admitting: Gastroenterology

## 2021-10-29 ENCOUNTER — Ambulatory Visit (HOSPITAL_COMMUNITY)
Admission: RE | Admit: 2021-10-29 | Discharge: 2021-10-29 | Disposition: A | Discharge status: Home / Self Care | Payer: Medicare HMO | Source: Ambulatory Visit | Attending: Gastroenterology | Admitting: Gastroenterology

## 2021-10-29 ENCOUNTER — Ambulatory Visit (HOSPITAL_BASED_OUTPATIENT_CLINIC_OR_DEPARTMENT_OTHER): Payer: Medicare HMO | Admitting: Anesthesiology

## 2021-10-29 ENCOUNTER — Ambulatory Visit (HOSPITAL_COMMUNITY): Payer: Medicare HMO | Admitting: Anesthesiology

## 2021-10-29 ENCOUNTER — Encounter (HOSPITAL_COMMUNITY)
Admission: RE | Disposition: A | Discharge status: Home / Self Care | Payer: Self-pay | Source: Ambulatory Visit | Attending: Gastroenterology

## 2021-10-29 ENCOUNTER — Other Ambulatory Visit: Payer: Self-pay

## 2021-10-29 ENCOUNTER — Encounter (INDEPENDENT_AMBULATORY_CARE_PROVIDER_SITE_OTHER): Payer: Self-pay | Admitting: *Deleted

## 2021-10-29 DIAGNOSIS — Z6841 Body Mass Index (BMI) 40.0 and over, adult: Secondary | ICD-10-CM | POA: Insufficient documentation

## 2021-10-29 DIAGNOSIS — K635 Polyp of colon: Secondary | ICD-10-CM | POA: Diagnosis not present

## 2021-10-29 DIAGNOSIS — D123 Benign neoplasm of transverse colon: Secondary | ICD-10-CM | POA: Insufficient documentation

## 2021-10-29 DIAGNOSIS — I10 Essential (primary) hypertension: Secondary | ICD-10-CM

## 2021-10-29 DIAGNOSIS — Z79899 Other long term (current) drug therapy: Secondary | ICD-10-CM | POA: Diagnosis not present

## 2021-10-29 DIAGNOSIS — J45909 Unspecified asthma, uncomplicated: Secondary | ICD-10-CM | POA: Insufficient documentation

## 2021-10-29 DIAGNOSIS — E119 Type 2 diabetes mellitus without complications: Secondary | ICD-10-CM | POA: Diagnosis not present

## 2021-10-29 DIAGNOSIS — Z1211 Encounter for screening for malignant neoplasm of colon: Secondary | ICD-10-CM | POA: Insufficient documentation

## 2021-10-29 HISTORY — PX: POLYPECTOMY: SHX5525

## 2021-10-29 HISTORY — PX: COLONOSCOPY WITH PROPOFOL: SHX5780

## 2021-10-29 LAB — GLUCOSE, CAPILLARY: Glucose-Capillary: 74 mg/dL (ref 70–99)

## 2021-10-29 LAB — HM COLONOSCOPY

## 2021-10-29 SURGERY — COLONOSCOPY WITH PROPOFOL
Anesthesia: General

## 2021-10-29 MED ORDER — PROPOFOL 500 MG/50ML IV EMUL
INTRAVENOUS | Status: DC | PRN
  Administered 2021-10-29 (×4): 50 mg via INTRAVENOUS

## 2021-10-29 MED ORDER — LACTATED RINGERS IV SOLN: INTRAVENOUS | Status: DC | PRN

## 2021-10-29 MED ORDER — LIDOCAINE HCL (PF) 1 % IJ SOLN
INTRAMUSCULAR | Status: AC
  Filled 2021-10-29: qty 2

## 2021-10-29 NOTE — H&P (Signed)
Cynthia Calderon is an 71 y.o. female.   Chief Complaint: Screening colonoscopy HPI: 71 year old female with past medical history of diabetes, asthma, hypertension, coming for screening colonoscopy.  Last colonoscopy was performed 10 years ago, was within normal limits.  The patient denies having any complaints such as melena, hematochezia, abdominal pain or distention, change in her bowel movement consistency or frequency, no changes in weight recently.  No family history of colorectal cancer.   Past Medical History:  Diagnosis Date   Anemia    Arthritis    Bronchial asthma    Last Spring   Diabetes mellitus without complication ([place])    Dyspnea    History of kidney stones    Hypertension     Past Surgical History:  Procedure Laterality Date   ARTHOSCOPIC ROTAOR CUFF REPAIR  01/06/2010   COLONOSCOPY  08/07/2011   Procedure: COLONOSCOPY;  Surgeon: Rogene Houston, MD;  Location: [facility];  Service: Endoscopy;  Laterality: N/A;  930   CYSTOSCOPY/URETEROSCOPY/HOLMIUM LASER/STENT PLACEMENT Left 07/05/2020   Procedure: LEFT URETEROSCOPY/HOLMIUM LASER STONE EXTRACTION STENT PLACEMENT;  Surgeon: Ardis Hughs, MD;  Location: [facility];  Service: Urology;  Laterality: Left;   GASTRIC BYPASS  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 No pertinent family history. Social History:  reports that she has never smoked. She has never used smokeless tobacco. She reports that she does not drink alcohol and does not use drugs.  Allergies: No Known Allergies  Medications Prior to Admission  Medication Sig Dispense Refill   acetaminophen (TYLENOL) 500 MG tablet Take 500-1,000 mg by mouth every 6 (six) hours as needed for moderate pain. For pain     albuterol (VENTOLIN HFA) 108 (90 Base) MCG/ACT inhaler Inhale 2 puffs into the lungs every 6 (six) hours as needed for wheezing or shortness of breath.     Calcium Carbonate-Vitamin D (CALCIUM + D PO) Take 1 tablet by mouth  daily.     cyanocobalamin (,VITAMIN B-12,) 1000 MCG/ML injection Inject 1,000 mcg into the muscle every 30 (thirty) days.     fluticasone (FLONASE) 50 MCG/ACT nasal spray Place 1 spray into both nostrils daily as needed for allergies or rhinitis.     lisinopril (ZESTRIL) 2.5 MG tablet Take 2.5 mg by mouth daily.     meloxicam (MOBIC) 7.5 MG tablet Take 7.5 mg by mouth daily.     Multiple Vitamin (MULTIVITAMIN WITH MINERALS) TABS Take 1 tablet by mouth daily.     omeprazole (PRILOSEC) 20 MG capsule Take 20 mg by mouth daily.     phenazopyridine (PYRIDIUM) 200 MG tablet Take 1 tablet (200 mg total) by mouth 3 (three) times daily as needed for pain. 10 tablet 0   polyethylene glycol-electrolytes (TRILYTE) 420 g solution Take 4,000 mLs by mouth as directed. 4000 mL 0   traMADol (ULTRAM) 50 MG tablet Take 1 tablet (50 mg total) by mouth every 6 (six) hours as needed for severe pain. 15 tablet 0   traZODone (DESYREL) 100 MG tablet Take 100 mg by mouth at bedtime.     Turmeric 500 MG CAPS Take 500 mg by mouth daily.     vitamin C (ASCORBIC ACID) 500 MG tablet Take 500 mg by mouth daily.     Vitamin D, Ergocalciferol, (DRISDOL) 1.25 MG (50000 UNIT) CAPS capsule Take 50,000 Units by mouth every 7 (seven) days.     benzonatate (TESSALON) 100 MG capsule Take 100 mg by mouth 3 (three) times  daily as needed for cough.     Potassium 99 MG TABS Take 99 mg by mouth daily.      Results for orders placed or performed during the hospital encounter of 10/29/21 (from the past 48 hour(s))  Glucose, capillary     Status: None   Collection Time: 10/29/21  8:13 AM  Result Value Ref Range   Glucose-Capillary 74 70 - 99 mg/dL    Comment: Glucose reference range applies only to samples taken after fasting for at least 8 hours.   No results found.  Review of Systems  All other systems reviewed and are negative.   Pulse (!) 49, temperature 98 F (36.7 C), temperature source Oral, resp. rate 16, SpO2 100  %. Physical Exam  GENERAL: The patient is AO x3, in no acute distress. HEENT: Head is normocephalic and atraumatic. EOMI are intact. Mouth is well hydrated and without lesions. NECK: Supple. No masses LUNGS: Clear to auscultation. No presence of rhonchi/wheezing/rales. Adequate chest expansion HEART: RRR, normal s1 and s2. ABDOMEN: Soft, nontender, no guarding, no peritoneal signs, and nondistended. BS +. No masses. EXTREMITIES: Without any cyanosis, clubbing, rash, lesions or edema. NEUROLOGIC: AOx3, no focal motor deficit. SKIN: no jaundice, no rashes\ Assessment/Plan  71 year old female with past medical history of diabetes, asthma, hypertension, coming for screening colonoscopy. The patient is at average risk for colorectal cancer.  We will proceed with colonoscopy today.   Harvel Quale, MD 10/29/2021, 8:57 AM

## 2021-10-29 NOTE — Discharge Instructions (Signed)
You are being discharged to home.  Resume your previous diet.  We are waiting for your pathology results.  Your physician has indicated that a repeat colonoscopy is not recommended due to your current age (66 years or older) for screening purposes.  

## 2021-10-29 NOTE — Anesthesia Preprocedure Evaluation (Signed)
Anesthesia Evaluation  Patient identified by MRN, date of birth, ID band Patient awake    Reviewed: Allergy & Precautions, H&P , NPO status , Patient's Chart, lab work & pertinent test results, reviewed documented beta blocker date and time   Airway Mallampati: II  TM Distance: >3 FB Neck ROM: full    Dental no notable dental hx.    Pulmonary shortness of breath, asthma ,    Pulmonary exam normal breath sounds clear to auscultation       Cardiovascular Exercise Tolerance: Good hypertension, negative cardio ROS   Rhythm:regular Rate:Normal     Neuro/Psych negative neurological ROS  negative psych ROS   GI/Hepatic negative GI ROS, Neg liver ROS,   Endo/Other  diabetes, Type 2Morbid obesity  Renal/GU negative Renal ROS  negative genitourinary   Musculoskeletal   Abdominal   Peds  Hematology  (+) Blood dyscrasia, anemia ,   Anesthesia Other Findings   Reproductive/Obstetrics negative OB ROS                             Anesthesia Physical Anesthesia Plan  ASA: 3  Anesthesia Plan: General   Post-op Pain Management:    Induction:   PONV Risk Score and Plan: Propofol infusion  Airway Management Planned:   Additional Equipment:   Intra-op Plan:   Post-operative Plan:   Informed Consent: I have reviewed the patients History and Physical, chart, labs and discussed the procedure including the risks, benefits and alternatives for the proposed anesthesia with the patient or authorized representative who has indicated his/her understanding and acceptance.     Dental Advisory Given  Plan Discussed with: CRNA  Anesthesia Plan Comments:         Anesthesia Quick Evaluation

## 2021-10-29 NOTE — Transfer of Care (Signed)
Immediate Anesthesia Transfer of Care Note  Patient: Cynthia Calderon  Procedure(s) Performed: COLONOSCOPY WITH PROPOFOL POLYPECTOMY  Patient Location: PACU and Short Stay  Anesthesia Type:MAC  Level of Consciousness: awake, alert , oriented and patient cooperative  Airway & Oxygen Therapy: Patient Spontanous Breathing  Post-op Assessment: Report given to RN, Post -op Vital signs reviewed and stable and Patient moving all extremities X 4  Post vital signs: Reviewed and stable  Last Vitals:  Vitals Value Taken Time  BP    Temp    Pulse    Resp    SpO2      Last Pain:  Vitals:   10/29/21 0913  TempSrc:   PainSc: 0-No pain      Patients Stated Pain Goal: 6 (24/46/28 6381)  Complications: No notable events documented.

## 2021-10-29 NOTE — Op Note (Signed)
[facility] Patient Name: Cynthia Calderon Procedure Date: 10/29/2021 9:08 AM MRN: [id number] Date of Birth: Jul 09, 1950 Attending MD: Maylon Peppers ,  CSN: [id number] Age: 71 Admit Type: Outpatient Procedure:                Colonoscopy Indications:              Screening for colorectal malignant neoplasm Providers:                Maylon Peppers, Lambert Mody, Ladoris Gene                            Technician, Technician Referring MD:              Medicines:                Monitored Anesthesia Care Complications:            No immediate complications. Estimated Blood Loss:     Estimated blood loss: none. Procedure:                Pre-Anesthesia Assessment:                           - Prior to the procedure, a History and Physical                            was performed, and patient medications, allergies                            and sensitivities were reviewed. The patient's                            tolerance of previous anesthesia was reviewed.                           - The risks and benefits of the procedure and the                            sedation options and risks were discussed with the                            patient. All questions were answered and informed                            consent was obtained.                           - ASA Grade Assessment: III - A patient with severe                            systemic disease.                           After obtaining informed consent, the colonoscope                            was passed under direct vision. Throughout the  procedure, the patient's blood pressure, pulse, and                            oxygen saturations were monitored continuously. The                            [id number]-HQ190L ([id number]) scope was introduced through                            the anus and advanced to the the cecum, identified                            by appendiceal orifice and ileocecal valve.  The                            colonoscopy was technically difficult and complex                            due to very poor IV access - patient woke during                            the procedure and IV infiltrated. The patient                            tolerated the procedure well. The quality of the                            bowel preparation was excellent. Scope In: 10:09:14 AM Scope Out: 10:28:27 AM Scope Withdrawal Time: 0 hours 13 minutes 49 seconds  Total Procedure Duration: 0 hours 19 minutes 13 seconds  Findings:      The perianal and digital rectal examinations were normal.      A 4 mm polyp was found in the transverse colon. The polyp was sessile.       The polyp was removed with a cold snare. Resection and retrieval were       complete.      The retroflexed view of the distal rectum and anal verge was normal and       showed no anal or rectal abnormalities.      Note: if undergoing an endoscopic procedure in the future, would       recommend putting a PICC line for IV access. Impression:               - One 4 mm polyp in the transverse colon, removed                            with a cold snare. Resected and retrieved.                           - The distal rectum and anal verge are normal on                            retroflexion view. Moderate Sedation:      Per Anesthesia Care Recommendation:           -  Discharge patient to home (ambulatory).                           - Resume previous diet.                           - Await pathology results.                           - Repeat colonoscopy is not recommended due to                            current age (47 years or older) for screening                            purposes. Procedure Code(s):        --- Professional ---                           [phone], Colonoscopy, flexible; with removal of                            tumor(s), polyp(s), or other lesion(s) by snare                            technique Diagnosis  Code(s):        --- Professional ---                           Z12.11, Encounter for screening for malignant                            neoplasm of colon                           K63.5, Polyp of colon CPT copyright 2019 American Medical Association. All rights reserved. The codes documented in this report are preliminary and upon coder review may  be revised to meet current compliance requirements. Maylon Peppers, MD Maylon Peppers,  10/29/2021 10:32:27 AM This report has been signed electronically. Number of Addenda: 0

## 2021-10-30 LAB — SURGICAL PATHOLOGY

## 2021-10-31 NOTE — Anesthesia Postprocedure Evaluation (Signed)
Anesthesia Post Note  Patient: Cynthia Calderon  Procedure(s) Performed: COLONOSCOPY WITH PROPOFOL POLYPECTOMY  Patient location during evaluation: Phase II Anesthesia Type: General Level of consciousness: awake Pain management: pain level controlled Vital Signs Assessment: post-procedure vital signs reviewed and stable Respiratory status: spontaneous breathing and respiratory function stable Cardiovascular status: blood pressure returned to baseline and stable Postop Assessment: no headache and no apparent nausea or vomiting Anesthetic complications: no Comments: Late entry   No notable events documented.   Last Vitals:  Vitals:   10/29/21 0904 10/29/21 1034  BP: (!) 150/74 (!) 152/71  Pulse:  (!) 52  Resp:  16  Temp:  36.7 C  SpO2:  98%    Last Pain:  Vitals:   10/29/21 1034  TempSrc: Oral  PainSc: 0-No pain                 Louann Sjogren

## 2021-10-31 NOTE — Addendum Note (Signed)
Addendum  created 10/31/21 1412 by Jonna Munro, CRNA   Intraprocedure Staff edited

## 2021-11-04 ENCOUNTER — Encounter (HOSPITAL_COMMUNITY): Payer: Self-pay | Admitting: Gastroenterology

## 2022-02-11 NOTE — Congregational Nurse Program (Signed)
[facility] Dept: [phone]   Congregational Nurse Program Note  Date of Encounter: 02/11/2022  Past Medical History: Past Medical History:  Diagnosis Date   Anemia    Arthritis    Bronchial asthma    Last Spring   Diabetes mellitus without complication ([place])    Dyspnea    History of kidney stones    Hypertension     Encounter Details:  CNP Questionnaire - 02/11/22 1745       Questionnaire   Ask client: Do you give verbal consent for me to treat you today? Yes    Student Assistance N/A    Location Patient [place] [facility] [place], [facility]    Visit Setting with Client Organization    Patient Status Unknown    Sport and exercise psychologist or [place] Insurance;Medicare    Insurance/Financial Assistance Referral N/A    Medication N/A    Medical Provider Yes    Screening Referrals Made N/A    Medical Referrals Made N/A    Medical Appointment Made N/A    Recently w/o PCP, now 1st time PCP visit completed due to CNs referral or appointment made N/A    Food N/A    Transportation N/A    Housing/Utilities N/A    Interpersonal Safety N/A    Interventions N/A    Abnormal to Normal Screening Since Last CN Visit N/A    Screenings CN Performed Blood Pressure;Blood Glucose    Sent Client to Lab for: N/A    ED Visit Averted N/A    Life-Saving Intervention Made N/A           C /O pain in left shoulder but stated she has to find another md because her PCP has relocated BP 155/78 P 57,blood glucose 122. Erma Heritage RN

## 2022-03-13 NOTE — Congregational Nurse Program (Signed)
  Dept: [phone]   Congregational Nurse Program Note  Date of Encounter: 03/13/2022  Past Medical History: Past Medical History:  Diagnosis Date   Anemia    Arthritis    Bronchial asthma    Last Spring   Diabetes mellitus without complication ([place])    Dyspnea    History of kidney stones    Hypertension     Encounter Details:  No complaints or concerns. BP 12/83 P 61 Erma Heritage RN

## 2022-03-13 NOTE — Congregational Nurse Program (Signed)
No complaints. Requested blood glucose and BP  BP 166/80 P80 Blood Glucose 76 Erma Heritage RN

## 2022-03-24 ENCOUNTER — Other Ambulatory Visit (HOSPITAL_COMMUNITY): Payer: Self-pay | Admitting: Family Medicine

## 2022-03-24 DIAGNOSIS — Z1231 Encounter for screening mammogram for malignant neoplasm of breast: Secondary | ICD-10-CM

## 2022-03-31 ENCOUNTER — Ambulatory Visit (HOSPITAL_COMMUNITY)
Admission: RE | Admit: 2022-03-31 | Discharge: 2022-03-31 | Disposition: A | Discharge status: Home / Self Care | Payer: Medicare HMO | Source: Ambulatory Visit | Attending: Family Medicine | Admitting: Family Medicine

## 2022-03-31 ENCOUNTER — Encounter (HOSPITAL_COMMUNITY): Payer: Self-pay

## 2022-03-31 DIAGNOSIS — Z1231 Encounter for screening mammogram for malignant neoplasm of breast: Secondary | ICD-10-CM | POA: Diagnosis present

## 2022-03-31 NOTE — Congregational Nurse Program (Signed)
  Dept: [phone]   Congregational Nurse Program Note  Date of Encounter: 03/25/2022  Past Medical History: Past Medical History:  Diagnosis Date   Anemia    Arthritis    Bronchial asthma    Last Spring   Diabetes mellitus without complication ([place])    Dyspnea    History of kidney stones    Hypertension     Encounter Details:  CNP Questionnaire - 03/25/22 1735       Questionnaire   Ask client: Do you give verbal consent for me to treat you today? Yes    Student Assistance N/A    Location Patient [place] [facility] [place], [facility]    Visit Setting with Client Organization    Patient Status Unknown    Insurance Medicare;Private or [place]    Insurance/Financial Assistance Referral N/A    Medication N/A    Medical Provider Yes    Screening Referrals Made N/A    Medical Referrals Made N/A    Medical Appointment Made N/A    Recently w/o PCP, now 1st time PCP visit completed due to CNs referral or appointment made N/A    Food N/A    Transportation N/A    Housing/Utilities N/A    Interpersonal Safety N/A    Interventions N/A    Abnormal to Normal Screening Since Last CN Visit N/A    Screenings CN Performed Blood Pressure;Blood Glucose    Sent Client to Lab for: N/A    Did client attend any of the following based off CNs referral or appointments made? N/A    ED Visit Averted N/A    Life-Saving Intervention Made N/A           No complaints or concerns. BP 105/76  HR 51, blool glucose 94. Erma Heritage ,RN

## 2022-08-15 ENCOUNTER — Other Ambulatory Visit: Payer: Self-pay | Admitting: Urology

## 2022-08-18 NOTE — Progress Notes (Signed)
Patient returned call. Instructions given for Litho. Arrival time 28. NPO after MN except a sip of water with AM meds. Hx and Meds reviewed. Driver secured. Bring in blue folder.

## 2022-08-18 NOTE — Progress Notes (Signed)
Left voicemail for patient to return call for instructions

## 2022-08-22 ENCOUNTER — Other Ambulatory Visit: Payer: Self-pay

## 2022-08-22 ENCOUNTER — Encounter (HOSPITAL_BASED_OUTPATIENT_CLINIC_OR_DEPARTMENT_OTHER)
Admission: RE | Disposition: A | Discharge status: Home / Self Care | Payer: Self-pay | Source: Home / Self Care | Attending: Urology

## 2022-08-22 ENCOUNTER — Encounter (HOSPITAL_BASED_OUTPATIENT_CLINIC_OR_DEPARTMENT_OTHER): Payer: Self-pay | Admitting: Urology

## 2022-08-22 ENCOUNTER — Ambulatory Visit (HOSPITAL_COMMUNITY): Payer: Medicare HMO

## 2022-08-22 ENCOUNTER — Ambulatory Visit (HOSPITAL_BASED_OUTPATIENT_CLINIC_OR_DEPARTMENT_OTHER)
Admission: RE | Admit: 2022-08-22 | Discharge: 2022-08-22 | Disposition: A | Discharge status: Home / Self Care | Payer: Medicare HMO | Attending: Urology | Admitting: Urology

## 2022-08-22 DIAGNOSIS — E669 Obesity, unspecified: Secondary | ICD-10-CM | POA: Insufficient documentation

## 2022-08-22 DIAGNOSIS — Z9884 Bariatric surgery status: Secondary | ICD-10-CM | POA: Insufficient documentation

## 2022-08-22 DIAGNOSIS — N200 Calculus of kidney: Secondary | ICD-10-CM | POA: Insufficient documentation

## 2022-08-22 HISTORY — PX: EXTRACORPOREAL SHOCK WAVE LITHOTRIPSY: SHX1557

## 2022-08-22 SURGERY — LITHOTRIPSY, ESWL
Anesthesia: LOCAL | Laterality: Left

## 2022-08-22 MED ORDER — DIAZEPAM 5 MG PO TABS
10.0000 mg | ORAL_TABLET | ORAL | Status: AC
  Administered 2022-08-22: 10 mg via ORAL

## 2022-08-22 MED ORDER — DIAZEPAM 5 MG PO TABS
ORAL_TABLET | ORAL | Status: AC
  Filled 2022-08-22: qty 2

## 2022-08-22 MED ORDER — DIPHENHYDRAMINE HCL 25 MG PO CAPS
ORAL_CAPSULE | ORAL | Status: AC
  Filled 2022-08-22: qty 1

## 2022-08-22 MED ORDER — CIPROFLOXACIN HCL 500 MG PO TABS
500.0000 mg | ORAL_TABLET | ORAL | Status: AC
  Administered 2022-08-22: 500 mg via ORAL

## 2022-08-22 MED ORDER — DIPHENHYDRAMINE HCL 25 MG PO CAPS
25.0000 mg | ORAL_CAPSULE | ORAL | Status: AC
  Administered 2022-08-22: 25 mg via ORAL

## 2022-08-22 MED ORDER — CIPROFLOXACIN HCL 500 MG PO TABS
ORAL_TABLET | ORAL | Status: AC
  Filled 2022-08-22: qty 1

## 2022-08-22 MED ORDER — TAMSULOSIN HCL 0.4 MG PO CAPS: 0.4000 mg | ORAL_CAPSULE | Freq: Every day | ORAL | 0 refills | Status: AC

## 2022-08-22 MED ORDER — SODIUM CHLORIDE 0.9 % IV SOLN: INTRAVENOUS | Status: DC

## 2022-08-22 NOTE — Discharge Instructions (Signed)
See [facility] discharge instructions in chart.  

## 2022-08-22 NOTE — H&P (Signed)
CC/HPI: 72 year old female here today for follow-up. She had a renal ultrasound prior to her appointment today. She is status post ureteroscopy in the summer of 2022 for a obstructing left mid ureteral stone. She did well from the operation and has not had any trouble. She denies any ongoing pain and has not had any dysuria or gross hematuria.   The patient was started on Myrbetriq 25 mg daily at her follow-up renal ultrasound after surgery. Since then the patient has had improved urinary urgency, but does continue to have some urgency and mild incontinence. The Myrbetriq certainly helped some.   7/24: Today the patient is here for 1 year from her last office. At her last office visit she had an ultrasound that demonstrated a possible calcification in her left kidney. The patient denies any passage of stone. She denies any flank pain. She denies any gross hematuria. She did have a KUB prior to her appointment today.   In addition the patient has not been able to continue on the Myrbetriq that we had started her on because of the cost associated with it. She has worsening incontinence and urinary frequency. She is wearing a pad it was all the time now.   Intv: Today the patient is here as a work in for flank pain. She states that she has had pain since she was seen last. She has had some mild nausea and vomiting. She has been taking aspirin for pain intermittently. Denies any fevers or chills.     ALLERGIES: None   MEDICATIONS: Lisinopril 2.5 mg tablet  Myrbetriq 50 mg tablet, extended release 24 hr 1 tablet PO Daily  Omeprazole 20 mg capsule,delayed release  Percocet  Tamsulosin Hcl 0.4 mg capsule  Fish Oil  Flonase Allergy Relief  Meloxicam 7.5 mg tablet  Multivitamin  Potassium  Proventil Hfa 90 mcg hfa aerosol with adapter  Singulair  Tramadol Hcl 50 mg tablet  Trazodone Hcl 100 mg tablet  Turmeric  Tylenol  Vitamin B12  Vitamin C  Vitamin D2 1,250 mcg (50,000 unit) capsule  Zofran      GU PSH: Ureteroscopic laser litho - 2022     NON-GU PSH: Gastric bypass, 1983 Visit Complexity (formerly GPC1X) - 07/21/2022     GU PMH: Renal calculus - 07/21/2022, - 07/15/2021, - 11/22/2020 Urinary Urgency - 07/21/2022, - 07/15/2021, - 11/22/2020 Urinary Frequency (Stable) - 11/22/2020, - 09/28/2020 Renal and ureteral calculus - 09/28/2020 Ureteral calculus - 2022, - 2022    NON-GU PMH: Hypertension Obesity    FAMILY HISTORY: 2 sons - Son Heart Disease - Runs in Family liver disease - Sister   SOCIAL HISTORY: Marital Status: Widowed Preferred Language: English; Ethnicity: Not Hispanic Or Latino; Race: Black or African American Current Smoking Status: Patient has never smoked.   Tobacco Use Assessment Completed: Used Tobacco in last 30 days? Has never drank.  Drinks 4+ caffeinated drinks per day.    REVIEW OF SYSTEMS:    GU Review Female:   Patient denies frequent urination, hard to postpone urination, burning /pain with urination, get up at night to urinate, leakage of urine, stream starts and stops, trouble starting your stream, have to strain to urinate, and being pregnant.  Gastrointestinal (Upper):   Patient denies nausea, vomiting, and indigestion/ heartburn.  Gastrointestinal (Lower):   Patient denies diarrhea and constipation.  Constitutional:   Patient denies fatigue, fever, weight loss, and night sweats.  Skin:   Patient denies skin rash/ lesion and itching.  Eyes:   Patient  denies blurred vision and double vision.  Ears/ Nose/ Throat:   Patient denies sore throat and sinus problems.  Hematologic/Lymphatic:   Patient denies swollen glands and easy bruising.  Cardiovascular:   Patient denies leg swelling and chest pains.  Respiratory:   Patient denies cough and shortness of breath.  Endocrine:   Patient denies excessive thirst.  Musculoskeletal:   Patient reports back pain. Patient denies joint pain.  Neurological:   Patient reports headaches. Patient denies  dizziness.  Psychologic:   Patient denies depression and anxiety.   VITAL SIGNS:      08/15/2022 02:14 PM 08/15/2022 02:12 PM  BP 173/94 mmHg 190/77 mmHg  Pulse   55 /min  Temperature   97.1 F / 36.1 C   MULTI-SYSTEM PHYSICAL EXAMINATION:    Constitutional: Well-nourished. No physical deformities. Normally developed. Good grooming.  Respiratory: Normal breath sounds. No labored breathing, no use of accessory muscles.   Cardiovascular: Regular rate and rhythm. No murmur, no gallop. Normal temperature, normal extremity pulses, no swelling, no varicosities.      Complexity of Data:  Source Of History:  Patient  Records Review:   Previous Doctor Records, Previous Patient Records, POC Tool  Urine Test Review:   Urinalysis   PROCEDURES:         KUB - 86578  A single view of the abdomen is obtained. Patient confirmed No Neulasta OnPro Device.  Renal shadows are easily visualized bilaterally. Calcification noted at the left UPJ There are no additional calcifications along the expected location of either ureter bilaterally.  Gas pattern is grossly normal. No significant bony abnormalities.      Impression: The patient has a stone at the left UPJ that is easily visualized on KUB.         Visit Complexity - G2211          Urinalysis w/Scope Dipstick Dipstick Cont'd Micro  Color: Yellow Bilirubin: Neg mg/dL WBC/hpf: NS (Not Seen)  Appearance: Slightly Cloudy Ketones: Neg mg/dL RBC/hpf: 0 - 2/hpf  Specific Gravity: 1.020 Blood: Trace ery/uL Bacteria: NS (Not Seen)  pH: <=5.0 Protein: Neg mg/dL Cystals: Amorph Urates  Glucose: Neg mg/dL Urobilinogen: 0.2 mg/dL Casts: NS (Not Seen)    Nitrites: Neg Trichomonas: Not Present    Leukocyte Esterase: Neg leu/uL Mucous: Not Present      Epithelial Cells: 6 - 10/hpf      Yeast: NS (Not Seen)      Sperm: Not Present    ASSESSMENT:      ICD-10 Details  1 GU:   Renal calculus - N20.0    PLAN:            Medications New Meds:  Tramadol Hcl 50 mg tablet 1-2 tablet PO Q 6 H PRN   #10  0 Refill(s)  Pharmacy Name:  [id number]  Address:  [address] [place], [place] [address]  Phone:  [phone]  Fax:  [phone]            Document Letter(s):  Created for Patient: Clinical Summary         Notes:   The patient has a stone at the left UPJ that may be causing intermittent pain. Fortunately she has no evidence of infection. We discussed treatment options and I recommended that she strongly consider shockwave lithotripsy. I went through the procedure with her in detail. We discussed the expected outcome as well as the potential risks. I will call in another prescription of  tramadol for her. Will try to get this scheduled for her quickly although she has recently taken aspirin and will need to be off this for at least 5 days.

## 2022-08-22 NOTE — Op Note (Signed)
See [facility] OP note scanned into chart. Also because of the size, density, location and other factors that cannot be anticipated I feel this will likely be a staged procedure. This fact supersedes any indication in the scanned [facility] operative note to the contrary.  

## 2022-08-25 ENCOUNTER — Encounter (HOSPITAL_BASED_OUTPATIENT_CLINIC_OR_DEPARTMENT_OTHER): Payer: Self-pay | Admitting: Urology

## 2022-10-10 LAB — HEMOGLOBIN A1C: Hemoglobin A1C: 5.6

## 2022-10-10 LAB — AMB RESULTS CONSOLE CBG: Glucose: 56

## 2022-10-10 NOTE — Progress Notes (Signed)
SDOH declined  Pt provider increased Lisinopril from 5mg  to 10mg  recently

## 2022-10-17 ENCOUNTER — Other Ambulatory Visit: Payer: Self-pay | Admitting: Urology

## 2022-11-10 ENCOUNTER — Encounter (HOSPITAL_BASED_OUTPATIENT_CLINIC_OR_DEPARTMENT_OTHER): Payer: Self-pay | Admitting: Urology

## 2022-11-10 ENCOUNTER — Other Ambulatory Visit: Payer: Self-pay

## 2022-11-10 NOTE — Progress Notes (Signed)
Spoke w/ via phone for pre-op interview: patient  Lab needs dos: EKG and I-stat Lab results: NA COVID test: patient states asymptomatic no test needed. Arrive at 0930 11/13/22. NPO after MN except clear liquids. Clear liquids from MN until 0830 AM Med rec completed. Medications to take morning of surgery: Albuterol PRN; Flonase, Flomax, and omeprazole Diabetic medication: NA Patient instructed no nail polish to be worn day of surgery. Patient instructed to bring photo id and insurance card day of surgery. Patient aware to have driver (ride ) / caregiver for 24 hours after surgery. Niece to drive. Special Instructions: NA Patient verbalized understanding of instructions that were given at this phone interview. Patient denies shortness of breath, chest pain, fever, cough at this phone interview.

## 2022-11-13 ENCOUNTER — Ambulatory Visit (HOSPITAL_BASED_OUTPATIENT_CLINIC_OR_DEPARTMENT_OTHER)
Admission: RE | Admit: 2022-11-13 | Discharge: 2022-11-13 | Disposition: A | Discharge status: Home / Self Care | Payer: Medicare HMO | Attending: Urology | Admitting: Urology

## 2022-11-13 ENCOUNTER — Encounter (HOSPITAL_BASED_OUTPATIENT_CLINIC_OR_DEPARTMENT_OTHER)
Admission: RE | Disposition: A | Discharge status: Home / Self Care | Payer: Self-pay | Source: Home / Self Care | Attending: Urology

## 2022-11-13 ENCOUNTER — Ambulatory Visit (HOSPITAL_BASED_OUTPATIENT_CLINIC_OR_DEPARTMENT_OTHER): Payer: Medicare HMO | Admitting: Anesthesiology

## 2022-11-13 ENCOUNTER — Encounter (HOSPITAL_BASED_OUTPATIENT_CLINIC_OR_DEPARTMENT_OTHER): Payer: Self-pay | Admitting: Urology

## 2022-11-13 DIAGNOSIS — Z79899 Other long term (current) drug therapy: Secondary | ICD-10-CM | POA: Diagnosis not present

## 2022-11-13 DIAGNOSIS — I10 Essential (primary) hypertension: Secondary | ICD-10-CM

## 2022-11-13 DIAGNOSIS — N202 Calculus of kidney with calculus of ureter: Secondary | ICD-10-CM | POA: Insufficient documentation

## 2022-11-13 DIAGNOSIS — N200 Calculus of kidney: Secondary | ICD-10-CM | POA: Diagnosis not present

## 2022-11-13 DIAGNOSIS — K219 Gastro-esophageal reflux disease without esophagitis: Secondary | ICD-10-CM | POA: Diagnosis not present

## 2022-11-13 DIAGNOSIS — J45909 Unspecified asthma, uncomplicated: Secondary | ICD-10-CM | POA: Diagnosis not present

## 2022-11-13 DIAGNOSIS — Z01818 Encounter for other preprocedural examination: Secondary | ICD-10-CM

## 2022-11-13 HISTORY — PX: CYSTOSCOPY/URETEROSCOPY/HOLMIUM LASER/STENT PLACEMENT: SHX6546

## 2022-11-13 LAB — BASIC METABOLIC PANEL
Anion gap: 10 (ref 5–15)
BUN: 16 mg/dL (ref 8–23)
CO2: 19 mmol/L — ABNORMAL LOW (ref 22–32)
Calcium: 8.8 mg/dL — ABNORMAL LOW (ref 8.9–10.3)
Chloride: 109 mmol/L (ref 98–111)
Creatinine, Ser: 1.03 mg/dL — ABNORMAL HIGH (ref 0.44–1.00)
GFR, Estimated: 58 mL/min — ABNORMAL LOW (ref >60)
Glucose, Bld: 87 mg/dL (ref 70–99)
Potassium: 4.2 mmol/L (ref 3.5–5.1)
Sodium: 138 mmol/L (ref 135–145)

## 2022-11-13 SURGERY — CYSTOSCOPY/URETEROSCOPY/HOLMIUM LASER/STENT PLACEMENT
Anesthesia: General | Site: Ureter | Laterality: Left

## 2022-11-13 MED ORDER — EPHEDRINE SULFATE-NACL 50-0.9 MG/10ML-% IV SOSY
PREFILLED_SYRINGE | INTRAVENOUS | Status: DC | PRN
  Administered 2022-11-13 (×2): 10 mg via INTRAVENOUS

## 2022-11-13 MED ORDER — FENTANYL CITRATE (PF) 100 MCG/2ML IJ SOLN
INTRAMUSCULAR | Status: DC | PRN
  Administered 2022-11-13: 25 ug via INTRAVENOUS
  Administered 2022-11-13: 75 ug via INTRAVENOUS
  Administered 2022-11-13: 25 ug via INTRAVENOUS

## 2022-11-13 MED ORDER — LACTATED RINGERS IV SOLN
INTRAVENOUS | Status: DC
Start: 2022-11-13 — End: 2022-11-13

## 2022-11-13 MED ORDER — LIDOCAINE 2% (20 MG/ML) 5 ML SYRINGE
INTRAMUSCULAR | Status: DC | PRN
  Administered 2022-11-13: 60 mg via INTRAVENOUS

## 2022-11-13 MED ORDER — ONDANSETRON HCL 4 MG/2ML IJ SOLN
INTRAMUSCULAR | Status: DC | PRN
  Administered 2022-11-13: 4 mg via INTRAVENOUS

## 2022-11-13 MED ORDER — ACETAMINOPHEN 160 MG/5ML PO SOLN: 325.0000 mg | ORAL | Status: DC | PRN

## 2022-11-13 MED ORDER — PHENAZOPYRIDINE HCL 200 MG PO TABS: 200.0000 mg | ORAL_TABLET | Freq: Three times a day (TID) | ORAL | 0 refills | Status: AC | PRN

## 2022-11-13 MED ORDER — OXYCODONE HCL 5 MG PO TABS: 5.0000 mg | ORAL_TABLET | Freq: Once | ORAL | Status: DC | PRN

## 2022-11-13 MED ORDER — OXYCODONE HCL 5 MG/5ML PO SOLN
5.0000 mg | Freq: Once | ORAL | Status: DC | PRN
Start: 2022-11-13 — End: 2022-11-13

## 2022-11-13 MED ORDER — DEXAMETHASONE SODIUM PHOSPHATE 10 MG/ML IJ SOLN
INTRAMUSCULAR | Status: DC | PRN
  Administered 2022-11-13: 10 mg via INTRAVENOUS

## 2022-11-13 MED ORDER — ONDANSETRON HCL 4 MG/2ML IJ SOLN
INTRAMUSCULAR | Status: AC
  Filled 2022-11-13: qty 2

## 2022-11-13 MED ORDER — CEFAZOLIN SODIUM-DEXTROSE 2-4 GM/100ML-% IV SOLN
2.0000 g | INTRAVENOUS | Status: AC
  Administered 2022-11-13: 2 g via INTRAVENOUS

## 2022-11-13 MED ORDER — LIDOCAINE HCL (PF) 2 % IJ SOLN
INTRAMUSCULAR | Status: AC
  Filled 2022-11-13: qty 5

## 2022-11-13 MED ORDER — ACETAMINOPHEN 325 MG PO TABS: 325.0000 mg | ORAL_TABLET | ORAL | Status: DC | PRN

## 2022-11-13 MED ORDER — KETOROLAC TROMETHAMINE 30 MG/ML IJ SOLN
INTRAMUSCULAR | Status: AC
  Filled 2022-11-13: qty 1

## 2022-11-13 MED ORDER — PROPOFOL 10 MG/ML IV BOLUS
INTRAVENOUS | Status: DC | PRN
  Administered 2022-11-13: 120 mg via INTRAVENOUS

## 2022-11-13 MED ORDER — DROPERIDOL 2.5 MG/ML IJ SOLN: 0.6250 mg | Freq: Once | INTRAMUSCULAR | Status: DC | PRN

## 2022-11-13 MED ORDER — SODIUM CHLORIDE 0.9 % IV SOLN: INTRAVENOUS | Status: DC

## 2022-11-13 MED ORDER — ACETAMINOPHEN 500 MG PO TABS: 1000.0000 mg | ORAL_TABLET | Freq: Once | ORAL | Status: DC

## 2022-11-13 MED ORDER — SODIUM CHLORIDE 0.9 % IR SOLN
Status: DC | PRN
  Administered 2022-11-13: 3000 mL

## 2022-11-13 MED ORDER — FENTANYL CITRATE (PF) 100 MCG/2ML IJ SOLN
INTRAMUSCULAR | Status: AC
  Filled 2022-11-13: qty 2

## 2022-11-13 MED ORDER — ACETAMINOPHEN 10 MG/ML IV SOLN: 1000.0000 mg | Freq: Once | INTRAVENOUS | Status: DC | PRN

## 2022-11-13 MED ORDER — CEFAZOLIN SODIUM-DEXTROSE 2-4 GM/100ML-% IV SOLN
INTRAVENOUS | Status: AC
  Filled 2022-11-13: qty 100

## 2022-11-13 MED ORDER — DEXAMETHASONE SODIUM PHOSPHATE 10 MG/ML IJ SOLN
INTRAMUSCULAR | Status: AC
  Filled 2022-11-13: qty 1

## 2022-11-13 MED ORDER — EPHEDRINE 5 MG/ML INJ
INTRAVENOUS | Status: AC
  Filled 2022-11-13: qty 5

## 2022-11-13 MED ORDER — KETOROLAC TROMETHAMINE 30 MG/ML IJ SOLN
INTRAMUSCULAR | Status: DC | PRN
  Administered 2022-11-13: 30 mg via INTRAVENOUS

## 2022-11-13 MED ORDER — TRAMADOL HCL 50 MG PO TABS: 50.0000 mg | ORAL_TABLET | Freq: Four times a day (QID) | ORAL | 0 refills | Status: AC | PRN

## 2022-11-13 MED ORDER — CIPROFLOXACIN HCL 500 MG PO TABS: 500.0000 mg | ORAL_TABLET | Freq: Once | ORAL | 0 refills | Status: AC

## 2022-11-13 MED ORDER — IOHEXOL 300 MG/ML  SOLN
INTRAMUSCULAR | Status: DC | PRN
  Administered 2022-11-13: 10 mL

## 2022-11-13 MED ORDER — FENTANYL CITRATE (PF) 100 MCG/2ML IJ SOLN: 25.0000 ug | INTRAMUSCULAR | Status: DC | PRN

## 2022-11-13 SURGICAL SUPPLY — 22 items
BAG DRAIN URO-CYSTO SKYTR STRL (DRAIN) ×1 IMPLANT
BAG DRN UROCATH (DRAIN) ×1
BASKET STONE 1.7 NGAGE (UROLOGICAL SUPPLIES) IMPLANT
CATH URETL OPEN 5X70 (CATHETERS) ×1 IMPLANT
CLOTH BEACON ORANGE TIMEOUT ST (SAFETY) ×1 IMPLANT
COVER DOME SNAP 22 D (MISCELLANEOUS) IMPLANT
EXTRACTOR STONE 1.7FRX115CM (UROLOGICAL SUPPLIES) IMPLANT
GLOVE BIO SURGEON STRL SZ7.5 (GLOVE) ×1 IMPLANT
GOWN STRL REUS W/TWL XL LVL3 (GOWN DISPOSABLE) ×1 IMPLANT
GUIDEWIRE STR DUAL SENSOR (WIRE) ×1 IMPLANT
IV NS IRRIG 3000ML ARTHROMATIC (IV SOLUTION) ×2 IMPLANT
KIT TURNOVER CYSTO (KITS) ×1 IMPLANT
MANIFOLD NEPTUNE II (INSTRUMENTS) ×1 IMPLANT
NS IRRIG 500ML POUR BTL (IV SOLUTION) ×1 IMPLANT
PACK CYSTO (CUSTOM PROCEDURE TRAY) ×1 IMPLANT
SHEATH URETERAL 12FR 45CM (SHEATH) IMPLANT
SLEEVE SCD COMPRESS KNEE MED (STOCKING) ×1 IMPLANT
STENT URET 6FRX24 CONTOUR (STENTS) IMPLANT
TRACTIP FLEXIVA PULS ID 200XHI (Laser) IMPLANT
TRACTIP FLEXIVA PULSE ID 200 (Laser) ×1
TUBE CONNECTING 12X1/4 (SUCTIONS) IMPLANT
TUBING UROLOGY SET (TUBING) ×1 IMPLANT

## 2022-11-13 NOTE — Anesthesia Procedure Notes (Signed)
Procedure Name: LMA Insertion Date/Time: 11/13/2022 12:17 PM  Performed by: Francie Massing, CRNAPre-anesthesia Checklist: Patient identified, Emergency Drugs available, Suction available and Patient being monitored Patient Re-evaluated:Patient Re-evaluated prior to induction Oxygen Delivery Method: Circle system utilized Preoxygenation: Pre-oxygenation with 100% oxygen Induction Type: IV induction Ventilation: Mask ventilation without difficulty LMA: LMA inserted LMA Size: 4.0 Number of attempts: 1 Airway Equipment and Method: Bite block Placement Confirmation: positive ETCO2 Tube secured with: Tape Dental Injury: Teeth and Oropharynx as per pre-operative assessment

## 2022-11-13 NOTE — Discharge Instructions (Addendum)
DISCHARGE INSTRUCTIONS FOR KIDNEY STONE/URETERAL STENT   MEDICATIONS:  1.  Resume all your other meds from home - except do not take any extra narcotic pain meds that you may have at home.  2. Pyridium is to help with the burning/stinging when you urinate. 3. Tramadol is for moderate/severe pain, otherwise taking upto 1000 mg every 6 hours of plainTylenol will help treat your pain.   4. Take Cipro one hour prior to removal of your stent.   ACTIVITY:  1. No strenuous activity x 1week  2. No driving while on narcotic pain medications  3. Drink plenty of water  4. Continue to walk at home - you can still get blood clots when you are at home, so keep active, but don't over do it.  5. May return to work/school tomorrow or when you feel ready   BATHING:  1. You can shower and we recommend daily showers  2. You have a string coming from your urethra: The stent string is attached to your ureteral stent. Do not pull on this.   SIGNS/SYMPTOMS TO CALL:  Please call us if you have a fever greater than 101.5, uncontrolled nausea/vomiting, uncontrolled pain, dizziness, unable to urinate, bloody urine, chest pain, shortness of breath, leg swelling, leg pain, redness around wound, drainage from wound, or any other concerns or questions.   You can reach [place] at [phone].   FOLLOW-UP:  1. You have an appointment in 6 weeks with a ultrasound of your kidneys prior.   2. You have a string attached to your stent, you may remove it on Monday, Nov 11. To do this, pull the strings until the stents are completely removed. You may feel an odd sensation in your back.    [facility] [phone] Post Ureteroscopy With or Without Stent Instructions  Definitions:  Ureter: The duct that transports urine from the kidney to the bladder. Stent:   A plastic hollow tube that is placed into the ureter, from the kidney to the bladder to prevent the ureter from swelling shut.  GENERAL  INSTRUCTIONS:  Despite the fact that no skin incisions were used, the area around the ureter and bladder is raw and irritated. The stent is a foreign body which will further irritate the bladder wall. This irritation is manifested by increased frequency of urination, both day and night, and by an increase in the urge to urinate. In some, the urge to urinate is present almost always. Sometimes the urge is strong enough that you may not be able to stop yourself from urinating. The only real cure is to remove the stent and then give time for the bladder wall to heal which can't be done until the danger of the ureter swelling shut has passed, which varies.  You may see some blood in your urine while the stent is in place and a few days afterwards. Do not be alarmed, even if the urine was clear for a while. Get off your feet and drink lots of fluids until clearing occurs. If you start to pass clots or don't improve, call us.  DIET: You may return to your normal diet immediately. Because of the raw surface of your bladder, alcohol, spicy foods, acid type foods and drinks with caffeine may cause irritation or frequency and should be used in moderation. To keep your urine flowing freely and to avoid constipation, drink plenty of fluids during the day ( 8-10 glasses ). Tip: Avoid cranberry juice because it is very acidic.  ACTIVITY: Your physical activity doesn't need to be restricted. However, if you are very active, you may see some blood in your urine. We suggest that you reduce your activity under these circumstances until the bleeding has stopped.  BOWELS: It is important to keep your bowels regular during the postoperative period. Straining with bowel movements can cause bleeding. A bowel movement every other day is reasonable. Use a mild laxative if needed, such as Milk of Magnesia 2-3 tablespoons, or 2 Dulcolax tablets. Call if you continue to have problems. If you have been taking narcotics for pain,  before, during or after your surgery, you may be constipated. Take a laxative if necessary.   MEDICATION: You should resume your pre-surgery medications unless told not to. In addition you will often be given an antibiotic to prevent infection. These should be taken as prescribed until the bottles are finished unless you are having an unusual reaction to one of the drugs.  PROBLEMS YOU SHOULD REPORT TO [place]: Fevers over 100.5 Fahrenheit. Heavy bleeding, or clots ( See above notes about blood in urine ). Inability to urinate. Drug reactions ( hives, rash, nausea, vomiting, diarrhea ). Severe burning or pain with urination that is not improving.  FOLLOW-UP: You will need a follow-up appointment to monitor your progress. Call for this appointment at the number listed above. Usually the first appointment will be about three to fourteen days after your surgery.        Post Anesthesia Home Care Instructions  Activity: Get plenty of rest for the remainder of the day. A responsible individual must stay with you for 24 hours following the procedure.  For the next 24 hours, DO NOT: -Drive a car -Advertising copywriter -Drink alcoholic beverages -Take any medication unless instructed by your physician -Make any legal decisions or sign important papers.  Meals: Start with liquid foods such as gelatin or soup. Progress to regular foods as tolerated. Avoid greasy, spicy, heavy foods. If nausea and/or vomiting occur, drink only clear liquids until the nausea and/or vomiting subsides. Call your physician if vomiting continues.  Special Instructions/Symptoms: Your throat may feel dry or sore from the anesthesia or the breathing tube placed in your throat during surgery. If this causes discomfort, gargle with warm salt water. The discomfort should disappear within 24 hours.  If you had a scopolamine patch placed behind your ear for the management of post- operative nausea and/or vomiting:  1. The  medication in the patch is effective for 72 hours, after which it should be removed.  Wrap patch in a tissue and discard in the trash. Wash hands thoroughly with soap and water. 2. You may remove the patch earlier than 72 hours if you experience unpleasant side effects which may include dry mouth, dizziness or visual disturbances. 3. Avoid touching the patch. Wash your hands with soap and water after contact with the patch.   If needed, next dose of non steroidal anti inflammatory may be taken at 7:40 pm today,

## 2022-11-13 NOTE — Op Note (Signed)
Preoperative diagnosis:  Left nonobstructing lower pole renal stone  Postoperative diagnosis:  Same  Procedure: Cystoscopy, left retrograde pyelogram with interpretation Left ureteroscopy, laser lithotripsy and stone extraction Left ureteral stent placement  Surgeon: Crist Fat, MD  Anesthesia: General  Complications: None  Intraoperative findings:  #1: The patient's retrograde pyelogram demonstrated normal caliber ureter with no filling defect or abnormality.  There is no hydronephrosis.  There was a filling defect in the lower pole consistent the patient's known stone. #2: A 24 cm time 6 French double-J stent was left in the patient's left ureter.  There was a stent tether left behind, and pulled through the urethra and tucked into the patient's vagina.  EBL: Minimal  Specimens: None  Indication: Cynthia Calderon is a 72 y.o. patient with left sided flank pain and a large left-sided stone in the lower pole of the left kidney..  After reviewing the management options for treatment, he elected to proceed with the above surgical procedure(s). We have discussed the potential benefits and risks of the procedure, side effects of the proposed treatment, the likelihood of the patient achieving the goals of the procedure, and any potential problems that might occur during the procedure or recuperation. Informed consent has been obtained.  Description of procedure:  Consent was obtained the preoperative holding area.  He was then brought back to the operating room placed on the table in supine position.  General esthesia then induced and endotracheal tube was inserted.  He was placed in the dorsolithotomy position and prepped and draped in routine sterile fashion.  Timeout subsequently performed.  21 French 30 degree cystoscope was gently passed through the patient's urethra and in the bladder under vision guidance.  Using a 5 [place] open-ended ureteral catheter a left retrograde  pyelogram was performed the above findings.  I advanced a wire up through the open-ended catheter removing the catheter over the wire.  Then remove the scope over the wire.  I then repassed the scope and placed a second wire up into the left renal pelvis under fluoroscopic guidance.  I then advanced a 12/14 French ureteral access sheath up into the patient's left proximal ureter under fluoroscopic guidance, this went gently.  I remove the inner portion of the sheath as well as the second wire.  I subsequently advanced a flexible ureteroscope up into the patient's left collecting system performed pyeloscopy.  The stone was encountered in the lower pole with a very narrow infundibulum.  Using a 200 m laser fiber I fragmented the stone into many small pieces which I then subsequently extracted with an engage basket.  Once all the stone fragments were noted to be free.  I repeated pyeloscopy and did not find any additional stone fragments.  I then slowly backed out the ureteroscope and there were no additional stone fragments and minimal ureteral trauma.  I then advanced a 24 cm time 6 French double-J stent over the wire and advanced to the patient's urethral meatus prior to removing the wire.  Once the wire was out, the stent was noted to be well-positioned within the left upper pole.  I used the beak of the cystoscope to drain the patient's bladder and advanced the distal end into the bladder which a nice curl was noted to be in place.  I pulled the stent tether through the patient's urethra and tucked into the patient's vagina.  The patient was subsequently extubated returned to the PACU in stable condition.  Disposition: The patient is being  instructed to move her stent on Monday, November 11.

## 2022-11-13 NOTE — Transfer of Care (Signed)
Immediate Anesthesia Transfer of Care Note  Patient: Clementeen Hoof  Procedure(s) Performed: Procedure(s) (LRB): CYSTOSCOPY, LEFT RETROGRADE PYELOGRAM, LEFT URETEROSCOPY, HOLMIUM LASER LITHOTRIPSY, AND LEFT URETERAL STENT PLACEMENT (Left)  Patient Location: PACU  Anesthesia Type: General  Level of Consciousness: awake, oriented, sedated and patient cooperative  Airway & Oxygen Therapy: Patient Spontanous Breathing and Patient connected to face mask oxygen  Post-op Assessment: Report given to PACU RN and Post -op Vital signs reviewed and stable  Post vital signs: Reviewed and stable  Complications: No apparent anesthesia complications  Last Vitals:  Vitals Value Taken Time  BP 165/89 11/13/22 1349  Temp    Pulse 77 11/13/22 1350  Resp 21 11/13/22 1350  SpO2 100 % 11/13/22 1350  Vitals shown include unfiled device data.  Last Pain:  Vitals:   11/13/22 0939  TempSrc: Oral  PainSc: 0-No pain      Patients Stated Pain Goal: 5 (11/13/22 0939)  Complications: No notable events documented.

## 2022-11-13 NOTE — H&P (Signed)
72 year old female here today for follow-up. She had a renal ultrasound prior to her appointment today. She is status post ureteroscopy in the summer of 2022 for a obstructing left mid ureteral stone. She did well from the operation and has not had any trouble. She denies any ongoing pain and has not had any dysuria or gross hematuria.   The patient was started on Myrbetriq 25 mg daily at her follow-up renal ultrasound after surgery. Since then the patient has had improved urinary urgency, but does continue to have some urgency and mild incontinence. The Myrbetriq certainly helped some.   7/24: Today the patient is here for 1 year from her last office. At her last office visit she had an ultrasound that demonstrated a possible calcification in her left kidney. The patient denies any passage of stone. She denies any flank pain. She denies any gross hematuria. She did have a KUB prior to her appointment today.   In addition the patient has not been able to continue on the Myrbetriq that we had started her on because of the cost associated with it. She has worsening incontinence and urinary frequency. She is wearing a pad it was all the time now.   Intv: Today the patient is here as a work in for flank pain. She states that she has had pain since she was seen last. She has had some mild nausea and vomiting. She has been taking aspirin for pain intermittently. Denies any fevers or chills.   09/15/2022: Ms. Kreg Shropshire is a 72 year old female who underwent a left lithotripsy due to a left renal stone. She has not passed any fragments but denies any further pain or discomfort.   10/20/2022 72 year old female who presents today after she underwent a lithotripsy for a left-sided renal stone. She has not passed any stone fragments. She is having some slight left-sided flank pain. She denies fevers, chills. Ultrasound does show obstruction on the left kidney.     ALLERGIES: None   MEDICATIONS: Lisinopril 10  mg tablet  Myrbetriq 50 mg tablet, extended release 24 hr 1 tablet PO Daily  Omeprazole 20 mg capsule,delayed release  Tamsulosin Hcl 0.4 mg capsule  Tramadol Hcl 50 mg tablet 1-2 tablet PO Q 6 H PRN  Fish Oil  Flonase Allergy Relief  Meloxicam 7.5 mg tablet  Multivitamin  Potassium  Proventil Hfa 90 mcg hfa aerosol with adapter  Singulair  Tramadol Hcl 50 mg tablet  Trazodone Hcl 100 mg tablet  Turmeric  Tylenol  Vitamin B12  Vitamin C  Vitamin D2 1,250 mcg (50,000 unit) capsule  Zofran     GU PSH: Ureteroscopic laser litho - 2022     NON-GU PSH: Gastric bypass, 1983 Visit Complexity (formerly GPC1X) - 08/15/2022, 07/21/2022     GU PMH: Renal calculus - 09/15/2022, - 08/15/2022, - 07/21/2022, - 07/15/2021, - 11/22/2020 Urinary Urgency - 07/21/2022, - 07/15/2021, - 11/22/2020 Urinary Frequency (Stable) - 11/22/2020, - 2022 Renal and ureteral calculus - 2022 Ureteral calculus - 2022, - 2022    NON-GU PMH: Hypertension Obesity    FAMILY HISTORY: 2 sons - Son Heart Disease - Runs in Family liver disease - Sister   SOCIAL HISTORY: Marital Status: Widowed Preferred Language: English; Ethnicity: Not Hispanic Or Latino; Race: Black or African American Current Smoking Status: Patient has never smoked.   Tobacco Use Assessment Completed: Used Tobacco in last 30 days? Has never drank.  Drinks 4+ caffeinated drinks per day.    REVIEW OF SYSTEMS:  GU Review Female:   Patient denies frequent urination, hard to postpone urination, burning /pain with urination, get up at night to urinate, leakage of urine, stream starts and stops, trouble starting your stream, have to strain to urinate, and being pregnant.  Gastrointestinal (Upper):   Patient denies nausea, vomiting, and indigestion/ heartburn.  Gastrointestinal (Lower):   Patient denies diarrhea and constipation.  Constitutional:   Patient denies fever, night sweats, weight loss, and fatigue.  Skin:   Patient denies skin rash/  lesion and itching.  Eyes:   Patient denies blurred vision and double vision.  Ears/ Nose/ Throat:   Patient denies sore throat and sinus problems.  Musculoskeletal:   Patient reports back pain. Patient denies joint pain.  Neurological:   Patient denies headaches and dizziness.  Psychologic:   Patient denies depression and anxiety.   Notes: side pain    VITAL SIGNS:      10/16/2022 03:55 PM  Weight 271.5 lb / 123.15 kg  Height 66 in / 167.64 cm  BP 135/83 mmHg  Heart Rate 59 /min  Temperature 98.2 F / 36.7 C  BMI 43.8 kg/m   MULTI-SYSTEM PHYSICAL EXAMINATION:    Constitutional: Well-nourished. No physical deformities. Normally developed. Good grooming.  Respiratory: No labored breathing, no use of accessory muscles.   Cardiovascular: Normal temperature, normal extremity pulses, no swelling, no varicosities.  Skin: No paleness, no jaundice, no cyanosis. No lesion, no ulcer, no rash.  Neurologic / Psychiatric: Oriented to time, oriented to place, oriented to person. No depression, no anxiety, no agitation.  Gastrointestinal: No mass, no tenderness, no rigidity, non obese abdomen.     Complexity of Data:  Source Of History:  Patient  Records Review:   Previous Doctor Records, Previous Patient Records  Urine Test Review:   Urinalysis  X-Ray Review: KUB: Reviewed Films. Reviewed Report. Discussed With Patient.  Renal Ultrasound (Limited): Reviewed Films. Reviewed Report. Discussed With Patient.     PROCEDURES:         Renal Ultrasound (Limited) - 65784  Left Kidney: Length: 9.71 cm Depth: 3.96 cm Cortical Width: 1.44 cm Width: 4.41 cm    Left Kidney/Ureter:  1)Hydro visualized------2)1.91cm Lower Pole Stone  Bladder:  PVR = Bladder not visualized      Patient confirmed No Neulasta OnPro Device.            KUB - [id number]  A single view of the abdomen is obtained. within the left renal shadow there is a 1.4cm opacity. There is a phlebolith in the left pelvic inlet .  Prominent overlying bowel gas.       Patient confirmed No Neulasta OnPro Device.           Urinalysis w/Scope Dipstick Dipstick Cont'd Micro  Color: Yellow Bilirubin: Neg mg/dL WBC/hpf: 0 - 5/hpf  Appearance: Clear Ketones: Neg mg/dL RBC/hpf: 0 - 2/hpf  Specific Gravity: 1.020 Blood: Trace ery/uL Bacteria: NS (Not Seen)  pH: 5.5 Protein: Neg mg/dL Cystals: NS (Not Seen)  Glucose: Neg mg/dL Urobilinogen: 0.2 mg/dL Casts: NS (Not Seen)    Nitrites: Neg Trichomonas: Not Present    Leukocyte Esterase: Neg leu/uL Mucous: Not Present      Epithelial Cells: NS (Not Seen)      Yeast: NS (Not Seen)      Sperm: Not Present    ASSESSMENT:      ICD-10 Details  1 GU:   Renal and ureteral calculus - N20.2 Left, Acute, Systemic Symptoms   PLAN:  Orders Labs CULTURE, URINE          Document Letter(s):  Created for Patient: Clinical Summary         Notes:   Urine will be sent for culture today. KUB shows persistent left-sided renal stone that is causing obstruction on the left kidney. Urinalysis will be sent for precautionary culture today. Stone intervention was discussed in detail today. For ureteroscopy, the patient understands that there is a chance for a staged procedure. Patient also understands that there is risk for bleeding, infection, injury to surrounding organs, and general risks of anesthesia. The patient also understands the placement of a stent and the risks of stent placement including, risk for infection, the risk for pain, and the risk for injury. For ESWL, the patient understands that there is a chance of failure of procedure, there is also a risk for bruising, infection, bleeding, and injury to surrounding structures. The patient verbalized understanding to these risks.   Would like to proceed with ureteroscopy.

## 2022-11-13 NOTE — Interval H&P Note (Signed)
History and Physical Interval Note:  11/13/2022 12:01 PM  Cynthia Calderon  has presented today for surgery, with the diagnosis of LEFT RENAL PELVIC CALCULUS.  The various methods of treatment have been discussed with the patient and family. After consideration of risks, benefits and other options for treatment, the patient has consented to  Procedure(s): CYSTOSCOPY, LEFT RETROGRADE PYELOGRAM, LEFT URETEROSCOPY, HOLMIUM LASER LITHOTRIPSY, AND LEFT URETERAL STENT PLACEMENT (Left) as a surgical intervention.  The patient's history has been reviewed, patient examined, no change in status, stable for surgery.  I have reviewed the patient's chart and labs.  Questions were answered to the patient's satisfaction.     Crist Fat

## 2022-11-13 NOTE — Anesthesia Preprocedure Evaluation (Addendum)
Anesthesia Evaluation  Patient identified by MRN, date of birth, ID band Patient awake    Reviewed: Allergy & Precautions, NPO status , Patient's Chart, lab work & pertinent test results  Airway Mallampati: II  TM Distance: >3 FB Neck ROM: Full    Dental  (+) Upper Dentures, Lower Dentures   Pulmonary asthma    breath sounds clear to auscultation       Cardiovascular hypertension, Pt. on medications  Rhythm:Regular Rate:Normal     Neuro/Psych negative neurological ROS  negative psych ROS   GI/Hepatic Neg liver ROS,GERD  Medicated,,  Endo/Other  negative endocrine ROS    Renal/GU negative Renal ROS     Musculoskeletal  (+) Arthritis ,    Abdominal   Peds  Hematology  (+) Blood dyscrasia, anemia   Anesthesia Other Findings   Reproductive/Obstetrics                             Anesthesia Physical Anesthesia Plan  ASA: 3  Anesthesia Plan: General   Post-op Pain Management: Tylenol PO (pre-op)* and Toradol IV (intra-op)*   Induction: Intravenous  PONV Risk Score and Plan: 4 or greater and Ondansetron, Dexamethasone, Midazolam and Treatment may vary due to age or medical condition  Airway Management Planned: LMA  Additional Equipment: None  Intra-op Plan:   Post-operative Plan: Extubation in OR  Informed Consent: I have reviewed the patients History and Physical, chart, labs and discussed the procedure including the risks, benefits and alternatives for the proposed anesthesia with the patient or authorized representative who has indicated his/her understanding and acceptance.     Dental advisory given  Plan Discussed with: CRNA  Anesthesia Plan Comments:        Anesthesia Quick Evaluation

## 2022-11-17 ENCOUNTER — Encounter (HOSPITAL_BASED_OUTPATIENT_CLINIC_OR_DEPARTMENT_OTHER): Payer: Self-pay | Admitting: Urology

## 2022-11-17 NOTE — Anesthesia Postprocedure Evaluation (Signed)
Anesthesia Post Note  Patient: Cynthia Calderon  Procedure(s) Performed: CYSTOSCOPY, LEFT RETROGRADE PYELOGRAM, LEFT URETEROSCOPY, HOLMIUM LASER LITHOTRIPSY, AND LEFT URETERAL STENT PLACEMENT (Left: Ureter)     Patient location during evaluation: PACU Anesthesia Type: General Level of consciousness: awake and alert Pain management: pain level controlled Vital Signs Assessment: post-procedure vital signs reviewed and stable Respiratory status: spontaneous breathing, nonlabored ventilation, respiratory function stable and patient connected to nasal cannula oxygen Cardiovascular status: blood pressure returned to baseline and stable Postop Assessment: no apparent nausea or vomiting Anesthetic complications: no   No notable events documented.  Last Vitals:  Vitals:   11/13/22 1430 11/13/22 1511  BP:  (!) 179/79  Pulse: (!) 52 71  Resp: 18 (!) 21  Temp:    SpO2: 98% 99%                Shelton Silvas

## 2022-11-27 NOTE — Progress Notes (Unsigned)
The patient attended 10/10/22 screening event where her bp screening results were 158/82. At the event the patient noted she has a pcp and insurance. Pt also noted that she has bp meds and that the dosage had be upped from 5mg  to 10mg . Patient declined the SDOH questionnaire. Per chart review pt does have a pcp and the last ov with pcp was 04/25/22. Hypertension is already listed in the pts chart. Per chart review the pt had not taken her lisinopril since 10/04/22. Chart review did not indicate any future appts.   Cg will make sure that she made her dr aware of her recent bp results. Pt did not answer, a VM was left.CG will attempt another call at a later date. Pt did not answer and another VM was left.   Pt called in on 12/16/22 and stated she has made her pcp of her results. Pt stated she is back taking her lisinopril 10mg  daily. No additional [employer] team support indicated at this time.

## 2023-03-09 ENCOUNTER — Other Ambulatory Visit (HOSPITAL_COMMUNITY): Payer: Self-pay | Admitting: Family Medicine

## 2023-03-09 DIAGNOSIS — Z1231 Encounter for screening mammogram for malignant neoplasm of breast: Secondary | ICD-10-CM

## 2023-04-03 ENCOUNTER — Ambulatory Visit (HOSPITAL_COMMUNITY)
Admission: RE | Admit: 2023-04-03 | Discharge: 2023-04-03 | Disposition: A | Discharge status: Home / Self Care | Source: Ambulatory Visit | Attending: Family Medicine | Admitting: Family Medicine

## 2023-04-03 ENCOUNTER — Encounter (HOSPITAL_COMMUNITY): Payer: Self-pay

## 2023-04-03 DIAGNOSIS — Z1231 Encounter for screening mammogram for malignant neoplasm of breast: Secondary | ICD-10-CM | POA: Diagnosis present

## 2023-10-21 ENCOUNTER — Encounter (INDEPENDENT_AMBULATORY_CARE_PROVIDER_SITE_OTHER): Payer: Self-pay | Admitting: Gastroenterology

## 2023-11-26 IMAGING — MG MM DIGITAL SCREENING BILAT W/ TOMO AND CAD
6 of 10 series · 6 of 30 positions shown · non-contrast
Comparison: Previous exam(s).

ACR Breast Density Category a: The breast tissue is almost entirely
fatty.

CLINICAL DATA: Screening.

EXAM:
DIGITAL SCREENING BILATERAL MAMMOGRAM WITH TOMOSYNTHESIS AND CAD
TECHNIQUE: Bilateral screening digital craniocaudal and mediolateral oblique
mammograms were obtained. Bilateral screening digital breast
tomosynthesis was performed. The images were evaluated with
computer-aided detection.

[R CC synth-2D (1 of 2)]
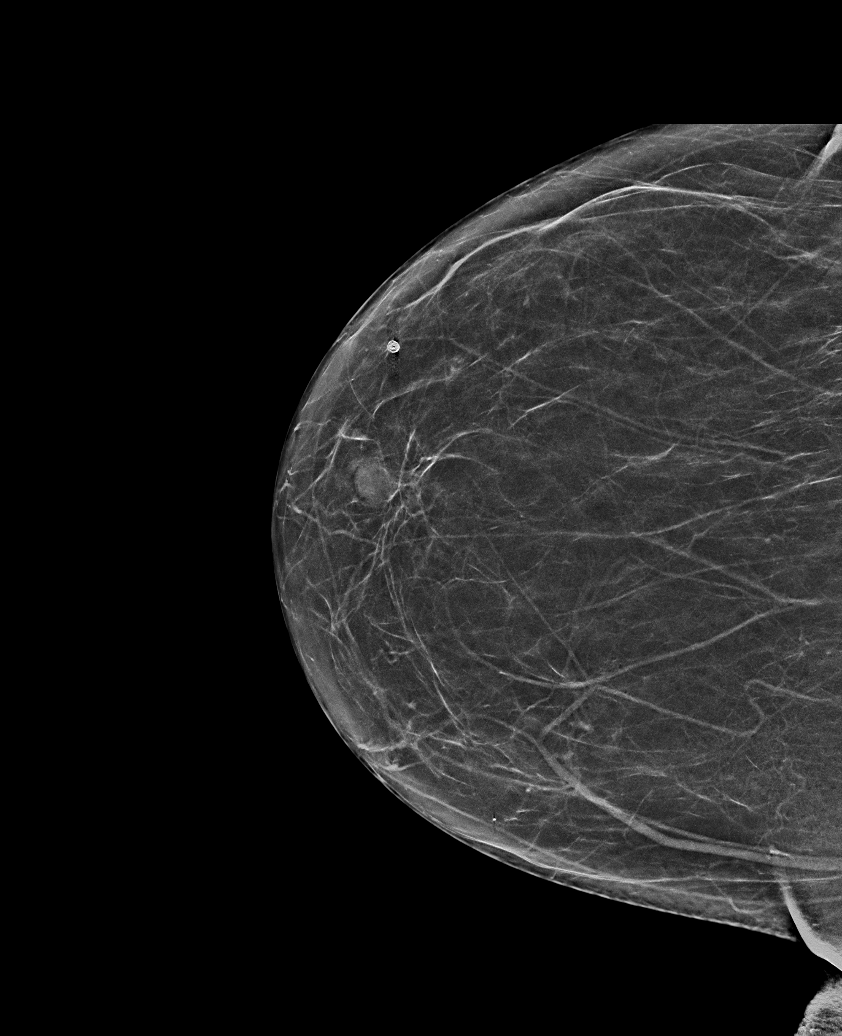

[R CC synth-2D (2 of 2)]
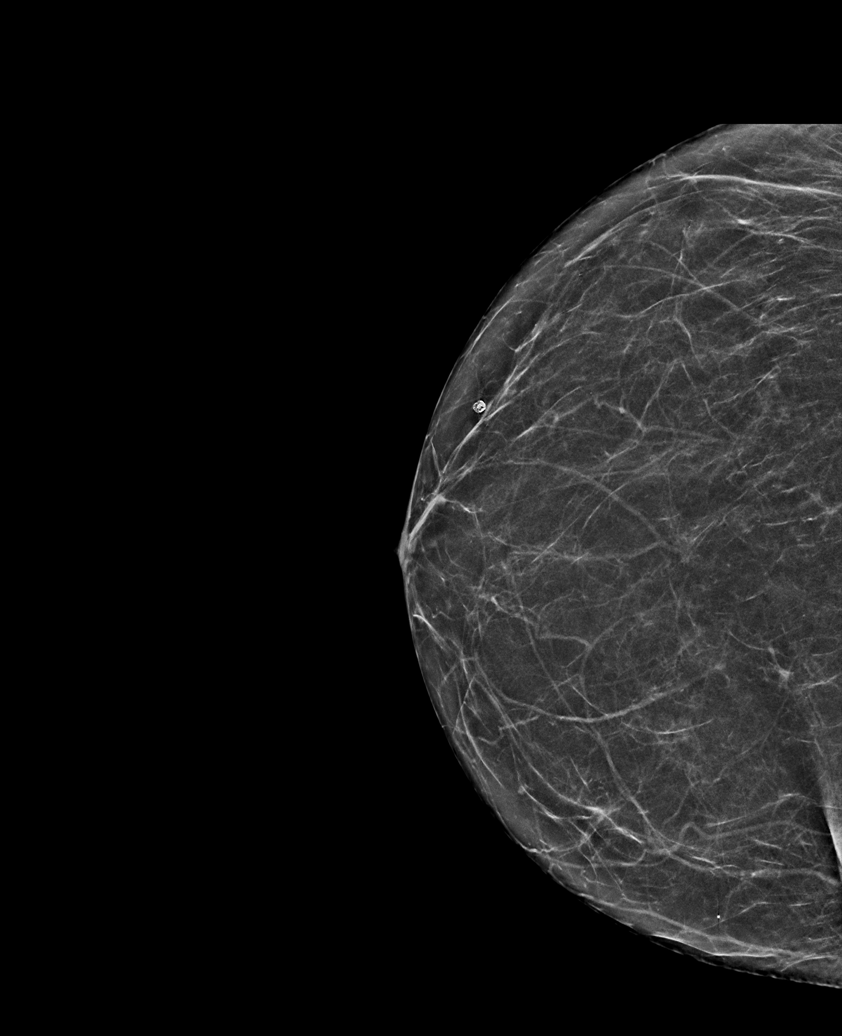

[L CC synth-2D]
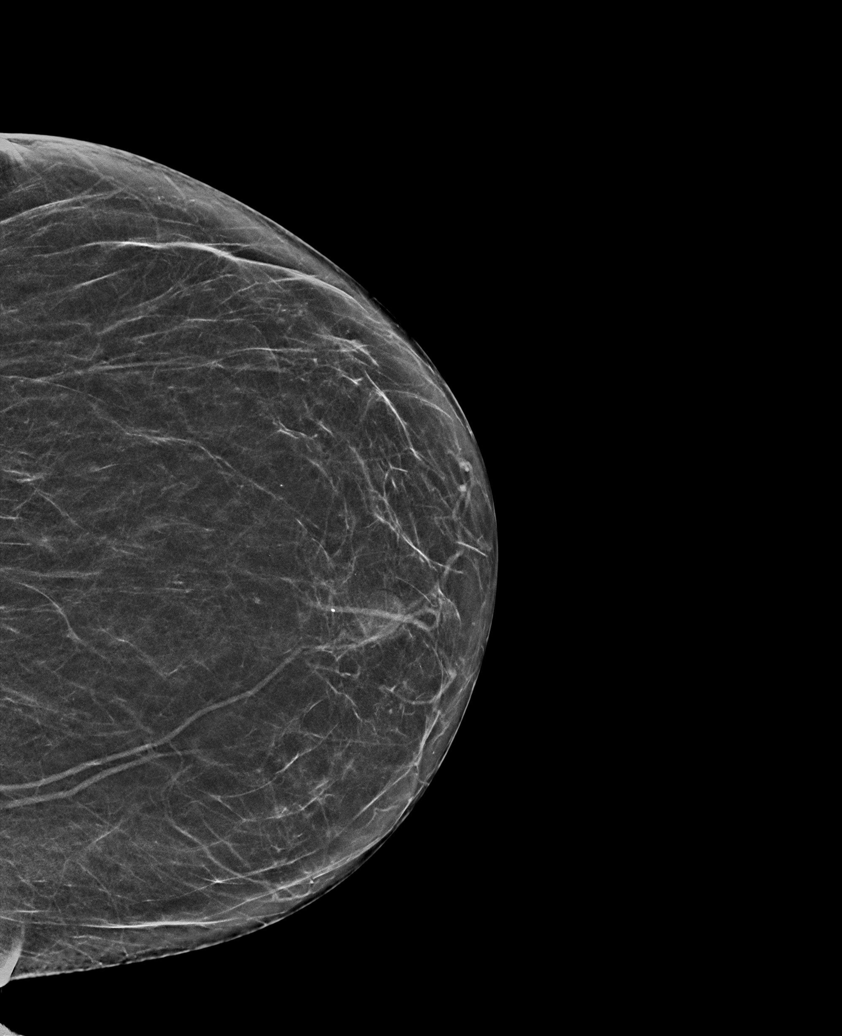

[R MLO synth-2D]
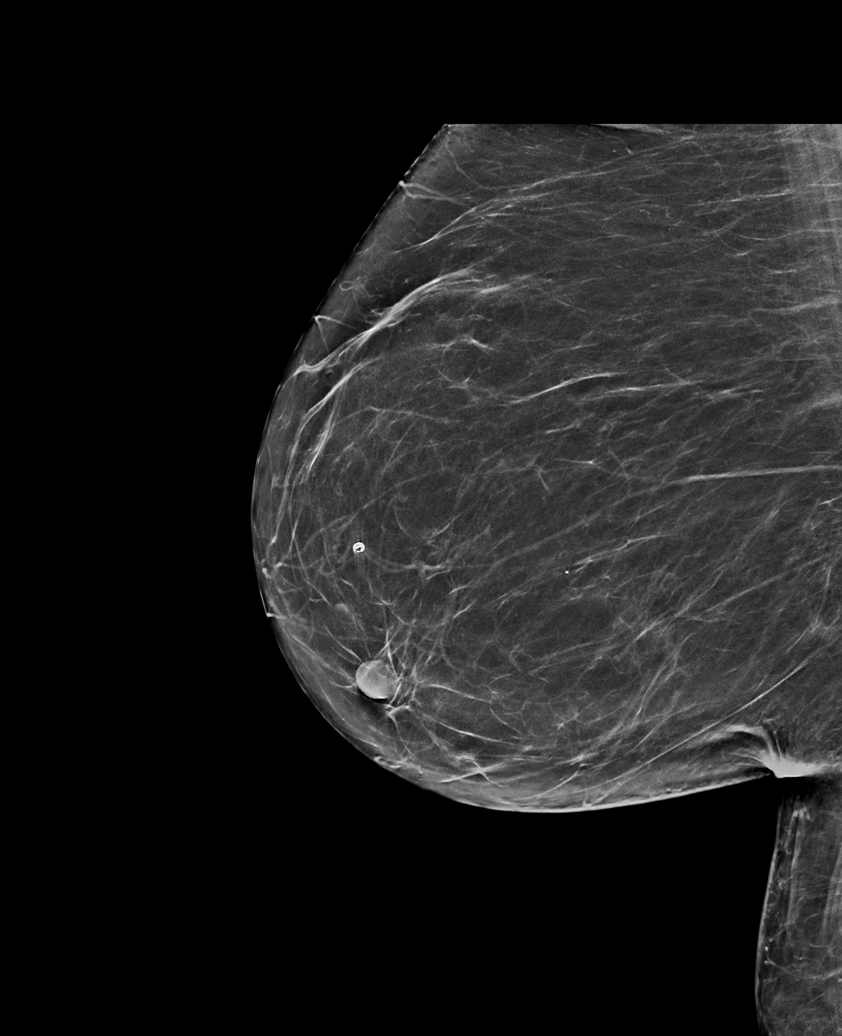

[L MLO synth-2D]
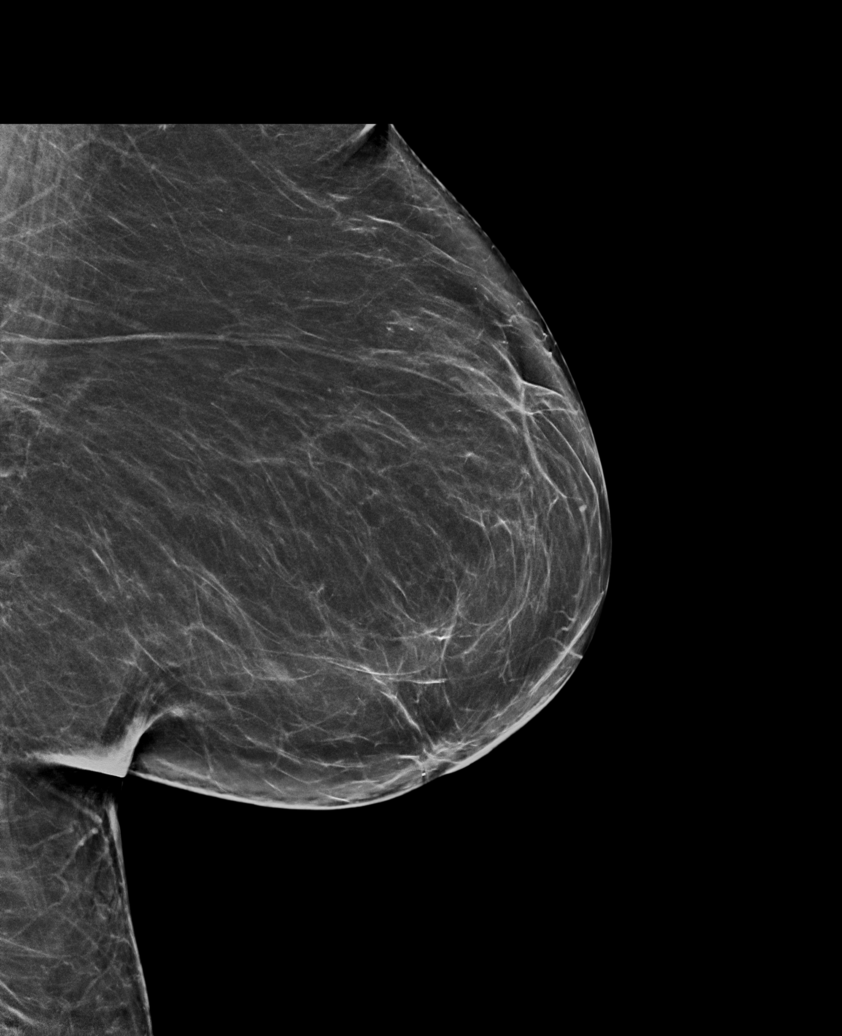

[R CC tomo · tomo slice 29/58.0]
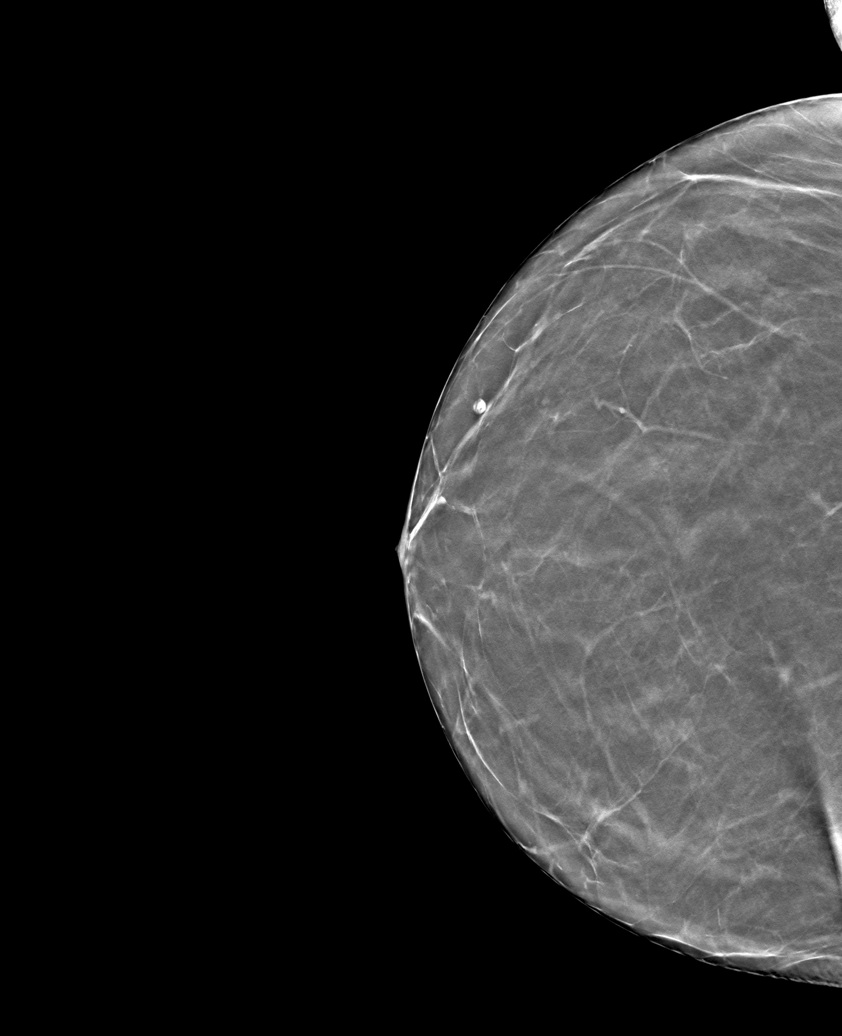

[6 of 30 positions shown; findings below may reference images not displayed]

FINDINGS: There are no findings suspicious for malignancy.
IMPRESSION: No mammographic evidence of malignancy. A result letter of this
screening mammogram will be mailed directly to the patient.

RECOMMENDATION:
Screening mammogram in one year. (Code:0E-3-N98)

BI-RADS CATEGORY  1: Negative.
